# Patient Record
Sex: Male | Born: 1945 | Race: White | Hispanic: No | Marital: Married | Smoking: Former smoker
Health system: Southern US, Community
[De-identification: ages and names within clinical notes are randomized; demographics above are authoritative.]

## PROBLEM LIST (undated history)

## (undated) DIAGNOSIS — J449 Chronic obstructive pulmonary disease, unspecified: Secondary | ICD-10-CM

## (undated) DIAGNOSIS — N5201 Erectile dysfunction due to arterial insufficiency: Secondary | ICD-10-CM

## (undated) DIAGNOSIS — I10 Essential (primary) hypertension: Secondary | ICD-10-CM

## (undated) DIAGNOSIS — I2510 Atherosclerotic heart disease of native coronary artery without angina pectoris: Secondary | ICD-10-CM

## (undated) DIAGNOSIS — K219 Gastro-esophageal reflux disease without esophagitis: Secondary | ICD-10-CM

## (undated) DIAGNOSIS — C801 Malignant (primary) neoplasm, unspecified: Secondary | ICD-10-CM

## (undated) DIAGNOSIS — F419 Anxiety disorder, unspecified: Secondary | ICD-10-CM

## (undated) HISTORY — DX: Chronic obstructive pulmonary disease, unspecified: J44.9

## (undated) HISTORY — DX: Erectile dysfunction due to arterial insufficiency: N52.01

## (undated) HISTORY — PX: CHOLECYSTECTOMY: SHX55

## (undated) HISTORY — DX: Gastro-esophageal reflux disease without esophagitis: K21.9

## (undated) HISTORY — DX: Atherosclerotic heart disease of native coronary artery without angina pectoris: I25.10

---

## 1999-12-12 ENCOUNTER — Emergency Department (HOSPITAL_COMMUNITY): Admission: EM | Admit: 1999-12-12 | Discharge: 1999-12-12 | Payer: Self-pay | Admitting: Emergency Medicine

## 2002-03-03 ENCOUNTER — Encounter: Payer: Self-pay | Admitting: *Deleted

## 2002-03-03 ENCOUNTER — Emergency Department (HOSPITAL_COMMUNITY): Admission: EM | Admit: 2002-03-03 | Discharge: 2002-03-03 | Payer: Self-pay | Admitting: *Deleted

## 2002-12-01 ENCOUNTER — Emergency Department (HOSPITAL_COMMUNITY): Admission: EM | Admit: 2002-12-01 | Discharge: 2002-12-01 | Payer: Self-pay | Admitting: Emergency Medicine

## 2009-04-23 HISTORY — PX: CARDIAC CATHETERIZATION: SHX172

## 2015-04-24 DIAGNOSIS — I639 Cerebral infarction, unspecified: Secondary | ICD-10-CM

## 2015-04-24 HISTORY — DX: Cerebral infarction, unspecified: I63.9

## 2017-05-24 DIAGNOSIS — I10 Essential (primary) hypertension: Secondary | ICD-10-CM | POA: Diagnosis not present

## 2017-05-24 DIAGNOSIS — R202 Paresthesia of skin: Secondary | ICD-10-CM | POA: Diagnosis not present

## 2017-05-24 DIAGNOSIS — M545 Low back pain: Secondary | ICD-10-CM | POA: Diagnosis not present

## 2017-05-24 DIAGNOSIS — J449 Chronic obstructive pulmonary disease, unspecified: Secondary | ICD-10-CM | POA: Diagnosis not present

## 2017-05-24 DIAGNOSIS — Z79899 Other long term (current) drug therapy: Secondary | ICD-10-CM | POA: Diagnosis not present

## 2017-05-28 DIAGNOSIS — Z79891 Long term (current) use of opiate analgesic: Secondary | ICD-10-CM | POA: Diagnosis not present

## 2017-05-28 DIAGNOSIS — J449 Chronic obstructive pulmonary disease, unspecified: Secondary | ICD-10-CM | POA: Diagnosis not present

## 2017-05-28 DIAGNOSIS — M545 Low back pain: Secondary | ICD-10-CM | POA: Diagnosis not present

## 2017-05-28 DIAGNOSIS — Z79899 Other long term (current) drug therapy: Secondary | ICD-10-CM | POA: Diagnosis not present

## 2017-05-29 DIAGNOSIS — Z79899 Other long term (current) drug therapy: Secondary | ICD-10-CM | POA: Diagnosis not present

## 2017-06-04 DIAGNOSIS — M79671 Pain in right foot: Secondary | ICD-10-CM | POA: Diagnosis not present

## 2017-06-04 DIAGNOSIS — R600 Localized edema: Secondary | ICD-10-CM | POA: Diagnosis not present

## 2017-06-04 DIAGNOSIS — Z79899 Other long term (current) drug therapy: Secondary | ICD-10-CM | POA: Diagnosis not present

## 2017-06-04 DIAGNOSIS — Z79891 Long term (current) use of opiate analgesic: Secondary | ICD-10-CM | POA: Diagnosis not present

## 2017-06-04 DIAGNOSIS — R202 Paresthesia of skin: Secondary | ICD-10-CM | POA: Diagnosis not present

## 2017-06-18 DIAGNOSIS — R202 Paresthesia of skin: Secondary | ICD-10-CM | POA: Diagnosis not present

## 2017-06-18 DIAGNOSIS — Z79891 Long term (current) use of opiate analgesic: Secondary | ICD-10-CM | POA: Diagnosis not present

## 2017-06-18 DIAGNOSIS — M545 Low back pain: Secondary | ICD-10-CM | POA: Diagnosis not present

## 2017-06-18 DIAGNOSIS — Z79899 Other long term (current) drug therapy: Secondary | ICD-10-CM | POA: Diagnosis not present

## 2017-07-02 DIAGNOSIS — Z79899 Other long term (current) drug therapy: Secondary | ICD-10-CM | POA: Diagnosis not present

## 2017-07-02 DIAGNOSIS — R252 Cramp and spasm: Secondary | ICD-10-CM | POA: Diagnosis not present

## 2017-07-02 DIAGNOSIS — M545 Low back pain: Secondary | ICD-10-CM | POA: Diagnosis not present

## 2017-07-02 DIAGNOSIS — Z79891 Long term (current) use of opiate analgesic: Secondary | ICD-10-CM | POA: Diagnosis not present

## 2017-07-30 DIAGNOSIS — I10 Essential (primary) hypertension: Secondary | ICD-10-CM | POA: Diagnosis not present

## 2017-07-30 DIAGNOSIS — Z79891 Long term (current) use of opiate analgesic: Secondary | ICD-10-CM | POA: Diagnosis not present

## 2017-07-30 DIAGNOSIS — Z79899 Other long term (current) drug therapy: Secondary | ICD-10-CM | POA: Diagnosis not present

## 2017-07-30 DIAGNOSIS — M545 Low back pain: Secondary | ICD-10-CM | POA: Diagnosis not present

## 2017-08-06 DIAGNOSIS — I10 Essential (primary) hypertension: Secondary | ICD-10-CM | POA: Diagnosis not present

## 2017-08-06 DIAGNOSIS — Z79891 Long term (current) use of opiate analgesic: Secondary | ICD-10-CM | POA: Diagnosis not present

## 2017-08-06 DIAGNOSIS — M545 Low back pain: Secondary | ICD-10-CM | POA: Diagnosis not present

## 2017-08-06 DIAGNOSIS — Z79899 Other long term (current) drug therapy: Secondary | ICD-10-CM | POA: Diagnosis not present

## 2017-09-04 DIAGNOSIS — I10 Essential (primary) hypertension: Secondary | ICD-10-CM | POA: Diagnosis not present

## 2017-09-04 DIAGNOSIS — Z79899 Other long term (current) drug therapy: Secondary | ICD-10-CM | POA: Diagnosis not present

## 2017-09-04 DIAGNOSIS — R202 Paresthesia of skin: Secondary | ICD-10-CM | POA: Diagnosis not present

## 2017-09-04 DIAGNOSIS — Z79891 Long term (current) use of opiate analgesic: Secondary | ICD-10-CM | POA: Diagnosis not present

## 2017-09-04 DIAGNOSIS — M545 Low back pain: Secondary | ICD-10-CM | POA: Diagnosis not present

## 2017-09-04 DIAGNOSIS — J449 Chronic obstructive pulmonary disease, unspecified: Secondary | ICD-10-CM | POA: Diagnosis not present

## 2017-10-08 DIAGNOSIS — Z79891 Long term (current) use of opiate analgesic: Secondary | ICD-10-CM | POA: Diagnosis not present

## 2017-10-08 DIAGNOSIS — I10 Essential (primary) hypertension: Secondary | ICD-10-CM | POA: Diagnosis not present

## 2017-10-08 DIAGNOSIS — Z79899 Other long term (current) drug therapy: Secondary | ICD-10-CM | POA: Diagnosis not present

## 2017-10-08 DIAGNOSIS — M545 Low back pain: Secondary | ICD-10-CM | POA: Diagnosis not present

## 2017-11-05 DIAGNOSIS — Z79899 Other long term (current) drug therapy: Secondary | ICD-10-CM | POA: Diagnosis not present

## 2017-11-05 DIAGNOSIS — M545 Low back pain: Secondary | ICD-10-CM | POA: Diagnosis not present

## 2017-11-05 DIAGNOSIS — Z79891 Long term (current) use of opiate analgesic: Secondary | ICD-10-CM | POA: Diagnosis not present

## 2017-12-04 DIAGNOSIS — Z79899 Other long term (current) drug therapy: Secondary | ICD-10-CM | POA: Diagnosis not present

## 2017-12-04 DIAGNOSIS — Z79891 Long term (current) use of opiate analgesic: Secondary | ICD-10-CM | POA: Diagnosis not present

## 2017-12-04 DIAGNOSIS — I10 Essential (primary) hypertension: Secondary | ICD-10-CM | POA: Diagnosis not present

## 2017-12-04 DIAGNOSIS — M545 Low back pain: Secondary | ICD-10-CM | POA: Diagnosis not present

## 2018-01-01 DIAGNOSIS — M545 Low back pain: Secondary | ICD-10-CM | POA: Diagnosis not present

## 2018-01-01 DIAGNOSIS — I10 Essential (primary) hypertension: Secondary | ICD-10-CM | POA: Diagnosis not present

## 2018-01-01 DIAGNOSIS — Z79899 Other long term (current) drug therapy: Secondary | ICD-10-CM | POA: Diagnosis not present

## 2018-01-01 DIAGNOSIS — Z79891 Long term (current) use of opiate analgesic: Secondary | ICD-10-CM | POA: Diagnosis not present

## 2018-01-30 DIAGNOSIS — M545 Low back pain: Secondary | ICD-10-CM | POA: Diagnosis not present

## 2018-01-30 DIAGNOSIS — R202 Paresthesia of skin: Secondary | ICD-10-CM | POA: Diagnosis not present

## 2018-01-30 DIAGNOSIS — Z79891 Long term (current) use of opiate analgesic: Secondary | ICD-10-CM | POA: Diagnosis not present

## 2018-01-30 DIAGNOSIS — I10 Essential (primary) hypertension: Secondary | ICD-10-CM | POA: Diagnosis not present

## 2018-01-30 DIAGNOSIS — E785 Hyperlipidemia, unspecified: Secondary | ICD-10-CM | POA: Diagnosis not present

## 2018-01-30 DIAGNOSIS — Z79899 Other long term (current) drug therapy: Secondary | ICD-10-CM | POA: Diagnosis not present

## 2018-02-27 DIAGNOSIS — I10 Essential (primary) hypertension: Secondary | ICD-10-CM | POA: Diagnosis not present

## 2018-02-27 DIAGNOSIS — M545 Low back pain: Secondary | ICD-10-CM | POA: Diagnosis not present

## 2018-02-27 DIAGNOSIS — J449 Chronic obstructive pulmonary disease, unspecified: Secondary | ICD-10-CM | POA: Diagnosis not present

## 2018-02-27 DIAGNOSIS — Z79899 Other long term (current) drug therapy: Secondary | ICD-10-CM | POA: Diagnosis not present

## 2018-02-27 DIAGNOSIS — J439 Emphysema, unspecified: Secondary | ICD-10-CM | POA: Diagnosis not present

## 2018-04-02 DIAGNOSIS — R202 Paresthesia of skin: Secondary | ICD-10-CM | POA: Diagnosis not present

## 2018-04-02 DIAGNOSIS — Z79891 Long term (current) use of opiate analgesic: Secondary | ICD-10-CM | POA: Diagnosis not present

## 2018-04-02 DIAGNOSIS — Z79899 Other long term (current) drug therapy: Secondary | ICD-10-CM | POA: Diagnosis not present

## 2018-04-02 DIAGNOSIS — J449 Chronic obstructive pulmonary disease, unspecified: Secondary | ICD-10-CM | POA: Diagnosis not present

## 2018-04-02 DIAGNOSIS — M545 Low back pain: Secondary | ICD-10-CM | POA: Diagnosis not present

## 2018-04-02 DIAGNOSIS — I10 Essential (primary) hypertension: Secondary | ICD-10-CM | POA: Diagnosis not present

## 2018-05-05 DIAGNOSIS — K219 Gastro-esophageal reflux disease without esophagitis: Secondary | ICD-10-CM | POA: Diagnosis not present

## 2018-05-05 DIAGNOSIS — Z79899 Other long term (current) drug therapy: Secondary | ICD-10-CM | POA: Diagnosis not present

## 2018-05-05 DIAGNOSIS — M79672 Pain in left foot: Secondary | ICD-10-CM | POA: Diagnosis not present

## 2018-05-05 DIAGNOSIS — I10 Essential (primary) hypertension: Secondary | ICD-10-CM | POA: Diagnosis not present

## 2018-05-05 DIAGNOSIS — M5442 Lumbago with sciatica, left side: Secondary | ICD-10-CM | POA: Diagnosis not present

## 2018-05-05 DIAGNOSIS — M79671 Pain in right foot: Secondary | ICD-10-CM | POA: Diagnosis not present

## 2018-05-05 DIAGNOSIS — M545 Low back pain: Secondary | ICD-10-CM | POA: Diagnosis not present

## 2018-06-03 DIAGNOSIS — Z79899 Other long term (current) drug therapy: Secondary | ICD-10-CM | POA: Diagnosis not present

## 2018-06-03 DIAGNOSIS — M5442 Lumbago with sciatica, left side: Secondary | ICD-10-CM | POA: Diagnosis not present

## 2018-06-03 DIAGNOSIS — K219 Gastro-esophageal reflux disease without esophagitis: Secondary | ICD-10-CM | POA: Diagnosis not present

## 2018-06-03 DIAGNOSIS — Z79891 Long term (current) use of opiate analgesic: Secondary | ICD-10-CM | POA: Diagnosis not present

## 2018-07-01 DIAGNOSIS — E785 Hyperlipidemia, unspecified: Secondary | ICD-10-CM | POA: Diagnosis not present

## 2018-07-01 DIAGNOSIS — M545 Low back pain: Secondary | ICD-10-CM | POA: Diagnosis not present

## 2018-07-01 DIAGNOSIS — Z79891 Long term (current) use of opiate analgesic: Secondary | ICD-10-CM | POA: Diagnosis not present

## 2018-07-01 DIAGNOSIS — Z79899 Other long term (current) drug therapy: Secondary | ICD-10-CM | POA: Diagnosis not present

## 2018-07-01 DIAGNOSIS — I10 Essential (primary) hypertension: Secondary | ICD-10-CM | POA: Diagnosis not present

## 2018-08-04 DIAGNOSIS — Z79891 Long term (current) use of opiate analgesic: Secondary | ICD-10-CM | POA: Diagnosis not present

## 2018-08-04 DIAGNOSIS — M545 Low back pain: Secondary | ICD-10-CM | POA: Diagnosis not present

## 2018-08-04 DIAGNOSIS — I10 Essential (primary) hypertension: Secondary | ICD-10-CM | POA: Diagnosis not present

## 2018-08-04 DIAGNOSIS — K219 Gastro-esophageal reflux disease without esophagitis: Secondary | ICD-10-CM | POA: Diagnosis not present

## 2018-08-13 DIAGNOSIS — K5790 Diverticulosis of intestine, part unspecified, without perforation or abscess without bleeding: Secondary | ICD-10-CM | POA: Diagnosis not present

## 2018-08-13 DIAGNOSIS — R0789 Other chest pain: Secondary | ICD-10-CM | POA: Diagnosis not present

## 2018-08-13 DIAGNOSIS — Z7902 Long term (current) use of antithrombotics/antiplatelets: Secondary | ICD-10-CM | POA: Diagnosis not present

## 2018-08-13 DIAGNOSIS — N200 Calculus of kidney: Secondary | ICD-10-CM | POA: Diagnosis not present

## 2018-08-13 DIAGNOSIS — Z79899 Other long term (current) drug therapy: Secondary | ICD-10-CM | POA: Diagnosis not present

## 2018-08-13 DIAGNOSIS — I700 Atherosclerosis of aorta: Secondary | ICD-10-CM | POA: Diagnosis not present

## 2018-08-13 DIAGNOSIS — S39012A Strain of muscle, fascia and tendon of lower back, initial encounter: Secondary | ICD-10-CM | POA: Diagnosis not present

## 2018-08-13 DIAGNOSIS — Z8673 Personal history of transient ischemic attack (TIA), and cerebral infarction without residual deficits: Secondary | ICD-10-CM | POA: Diagnosis not present

## 2018-08-13 DIAGNOSIS — Z87891 Personal history of nicotine dependence: Secondary | ICD-10-CM | POA: Diagnosis not present

## 2018-08-13 DIAGNOSIS — D3501 Benign neoplasm of right adrenal gland: Secondary | ICD-10-CM | POA: Diagnosis not present

## 2018-08-13 DIAGNOSIS — X500XXA Overexertion from strenuous movement or load, initial encounter: Secondary | ICD-10-CM | POA: Diagnosis not present

## 2018-08-13 DIAGNOSIS — I10 Essential (primary) hypertension: Secondary | ICD-10-CM | POA: Diagnosis not present

## 2018-09-01 DIAGNOSIS — K219 Gastro-esophageal reflux disease without esophagitis: Secondary | ICD-10-CM | POA: Diagnosis not present

## 2018-09-01 DIAGNOSIS — Z79899 Other long term (current) drug therapy: Secondary | ICD-10-CM | POA: Diagnosis not present

## 2018-09-01 DIAGNOSIS — I10 Essential (primary) hypertension: Secondary | ICD-10-CM | POA: Diagnosis not present

## 2018-09-01 DIAGNOSIS — Z79891 Long term (current) use of opiate analgesic: Secondary | ICD-10-CM | POA: Diagnosis not present

## 2018-09-01 DIAGNOSIS — M5442 Lumbago with sciatica, left side: Secondary | ICD-10-CM | POA: Diagnosis not present

## 2018-09-01 DIAGNOSIS — M545 Low back pain: Secondary | ICD-10-CM | POA: Diagnosis not present

## 2018-09-02 DIAGNOSIS — Z79891 Long term (current) use of opiate analgesic: Secondary | ICD-10-CM | POA: Diagnosis not present

## 2018-09-02 DIAGNOSIS — M5442 Lumbago with sciatica, left side: Secondary | ICD-10-CM | POA: Diagnosis not present

## 2019-12-14 ENCOUNTER — Ambulatory Visit: Payer: Self-pay | Admitting: Urology

## 2019-12-25 ENCOUNTER — Ambulatory Visit (INDEPENDENT_AMBULATORY_CARE_PROVIDER_SITE_OTHER): Payer: Medicare HMO | Admitting: Urology

## 2019-12-25 ENCOUNTER — Encounter: Payer: Self-pay | Admitting: Urology

## 2019-12-25 ENCOUNTER — Other Ambulatory Visit: Payer: Self-pay

## 2019-12-25 VITALS — BP 160/88 | HR 103 | Temp 98.2°F | Ht 72.0 in | Wt 212.0 lb

## 2019-12-25 DIAGNOSIS — R972 Elevated prostate specific antigen [PSA]: Secondary | ICD-10-CM | POA: Diagnosis not present

## 2019-12-25 DIAGNOSIS — N401 Enlarged prostate with lower urinary tract symptoms: Secondary | ICD-10-CM | POA: Diagnosis not present

## 2019-12-25 DIAGNOSIS — R351 Nocturia: Secondary | ICD-10-CM

## 2019-12-25 DIAGNOSIS — N138 Other obstructive and reflux uropathy: Secondary | ICD-10-CM

## 2019-12-25 DIAGNOSIS — R350 Frequency of micturition: Secondary | ICD-10-CM | POA: Diagnosis not present

## 2019-12-25 DIAGNOSIS — R3129 Other microscopic hematuria: Secondary | ICD-10-CM

## 2019-12-25 LAB — URINALYSIS, ROUTINE W REFLEX MICROSCOPIC
Bilirubin, UA: NEGATIVE
Glucose, UA: NEGATIVE
Leukocytes,UA: NEGATIVE
Nitrite, UA: NEGATIVE
Specific Gravity, UA: >1.03 — ABNORMAL HIGH (ref 1.005–1.030)
Urobilinogen, Ur: 0.2 mg/dL (ref 0.2–1.0)
pH, UA: 5.5 (ref 5.0–7.5)

## 2019-12-25 LAB — MICROSCOPIC EXAMINATION
Bacteria, UA: NONE SEEN
Epithelial Cells (non renal): NONE SEEN /hpf (ref 0–10)
Renal Epithel, UA: NONE SEEN /hpf
WBC, UA: NONE SEEN /hpf (ref 0–5)

## 2019-12-25 NOTE — Patient Instructions (Signed)
   Appointment Time: Appointment Date:  Location: [facility]   Prostate Biopsy Instructions  Stop all aspirin or blood thinners (aspirin, plavix, coumadin, warfarin, motrin, ibuprofen, advil, aleve, naproxen, naprosyn) for 7 days prior to the procedure.  If you have any questions about stopping these medications, please contact your primary care physician or cardiologist.  Having a light meal prior to the procedure is recommended.  If you are diabetic or have low blood sugar please bring a small snack or glucose tablet.  A Fleets enema is needed to be purchased over the counter at a local pharmacy and used 2 hours before you scheduled appointment.  This can be purchased over the counter at any pharmacy.  Antibiotics will be administered in the clinic at the time of the procedure and 1 tablet has been sent to your pharmacy. Please take the antibiotic as prescribed.    Please bring someone with you to the procedure to drive you home.   If you have any questions or concerns, please feel free to call the office at [phone] or send a Mychart message.    Thank you, [facility]

## 2019-12-25 NOTE — Progress Notes (Signed)
Subjective: 1. Elevated PSA   2. BPH with urinary obstruction   3. Urinary frequency   4. Nocturia   5. Microhematuria      Barry Alvarado is a 74 yo male who is sent in consultation by Dr. Jonny Ruiz for an elevated PSA with a further rise on a repeat.  I am unable to get the levels as they were not in the referral documents and we are unable to reach anyone in the referring office.  The patient is unaware of the levels.   He has had some issues with voiding and is on tamsulosin which has been helping.  He has nocturia x 1-2.  He had frequency but that has improved on tamsulosin.  He has urgency and has had UUI but that has improved.  He has a variable stream.  He feels like he doesn't empty completely.  He has no dysuria or hematuria.  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   He has had no UTI's but he may have had stones in the past but no GU surgery.  He has some issues with ED and has been given sildenafil.  He is a former smoker but none in 12 years.    ROS:  Review of Systems  Constitutional: Negative for chills, fever and weight loss.  Respiratory: Positive for shortness of breath.   Cardiovascular: Positive for leg swelling. Negative for chest pain.  Gastrointestinal: Positive for diarrhea and heartburn.  Genitourinary: Positive for frequency and urgency. Negative for flank pain and hematuria.  Musculoskeletal: Positive for back pain and joint pain.  Skin: Negative for itching and rash.  Neurological: Positive for focal weakness (left leg).  Endo/Heme/Allergies: Negative for polydipsia. Bruises/bleeds easily.  Psychiatric/Behavioral: The patient is nervous/anxious.   All other systems reviewed and are negative.   Allergies  Allergen Reactions  . Penicillins Hives  . Atorvastatin   . Gabapentin   . Omeprazole   . Shellfish Allergy   . Tramadol   . Zoloft [Sertraline]     Past Medical History:  Diagnosis Date  . CAD (coronary artery disease)   . COPD (chronic  obstructive pulmonary disease) ([place])   . CVA (cerebral vascular accident) ([place]) 2017   He has had 5 strokes.   . Erectile dysfunction due to arterial insufficiency   . GERD (gastroesophageal reflux disease)     Past Surgical History:  Procedure Laterality Date  . CHOLECYSTECTOMY  4-5 yrs ago    Social History   Socioeconomic History  . Marital status: Married    Spouse name: Not on file  . Number of children: Not on file  . Years of education: Not on file  . Highest education level: Not on file  Occupational History  . Not on file  Tobacco Use  . Smoking status: Former Research scientist (life sciences)  . Smokeless tobacco: Never Used  Substance and Sexual Activity  . Alcohol use: Yes    Comment: occasional  . Drug use: Not Currently  . Sexual activity: Yes  Other Topics Concern  . Not on file  Social History Narrative  . Not on file   Social Determinants of Health   Financial Resource Strain:   . Difficulty of Paying Living Expenses: Not on file  Food Insecurity:   . Worried About Charity fundraiser in the Last Year: Not on file  . Ran Out of Food in the Last Year: Not on file  Transportation Needs:   . Lack of Transportation (Medical): Not  on file  . Lack of Transportation (Non-Medical): Not on file  Physical Activity:   . Days of Exercise per Week: Not on file  . Minutes of Exercise per Session: Not on file  Stress:   . Feeling of Stress : Not on file  Social Connections:   . Frequency of Communication with Friends and Family: Not on file  . Frequency of Social Gatherings with Friends and Family: Not on file  . Attends Religious Services: Not on file  . Active Member of Clubs or Organizations: Not on file  . Attends Archivist Meetings: Not on file  . Marital Status: Not on file  Intimate Partner Violence:   . Fear of Current or Ex-Partner: Not on file  . Emotionally Abused: Not on file  . Physically Abused: Not on file  . Sexually Abused: Not on file    Family  History  Problem Relation Age of Onset  . Alzheimer's disease Mother   . Heart attack Father     Anti-infectives: Anti-infectives (From admission, onward)   None      Current Outpatient Medications  Medication Sig Dispense Refill  . amlodipine-atorvastatin (CADUET) 10-10 MG tablet Take 1 tablet by mouth daily.    . budesonide-formoterol (SYMBICORT) 160-4.5 MCG/ACT inhaler Inhale 2 puffs into the lungs 2 (two) times daily.    . clopidogrel (PLAVIX) 75 MG tablet Take 75 mg by mouth daily.    Marland Kitchen Dextran 70-Hypromellose 0.1-0.3 % SOLN Apply to eye.    . gabapentin (NEURONTIN) 300 MG capsule Take 300 mg by mouth 3 (three) times daily.    . hydrochlorothiazide (HYDRODIURIL) 50 MG tablet Take 50 mg by mouth daily.    Marland Kitchen lidocaine (LIDODERM) 5 % Place 1 patch onto the skin daily. Remove & Discard patch within 12 hours or as directed by MD    . LORazepam (ATIVAN) 1 MG tablet Take 1 mg by mouth every 8 (eight) hours.    . Multiple Vitamin (MULTIVITAMIN) tablet Take 1 tablet by mouth daily.    . sildenafil (VIAGRA) 100 MG tablet Take 100 mg by mouth daily as needed for erectile dysfunction.    . triamcinolone cream (KENALOG) 0.5 % Apply 1 application topically 3 (three) times daily.     No current facility-administered medications for this visit.     Objective: Vital signs in last 24 hours: BP (!) 160/88   Pulse (!) 103   Temp 98.2 F (36.8 C)   Ht 6' (1.829 m)   Wt 212 lb (96.2 kg)   BMI 28.75 kg/m   Intake/Output from previous day: No intake/output data recorded. Intake/Output this shift: @IOTHISSHIFT @   Physical Exam Vitals reviewed.  Constitutional:      Appearance: Normal appearance.  HENT:     Head: Normocephalic and atraumatic.  Cardiovascular:     Rate and Rhythm: Normal rate and regular rhythm.     Heart sounds: Normal heart sounds.  Pulmonary:     Effort: Pulmonary effort is normal. No respiratory distress.     Breath sounds: Normal breath sounds.  Abdominal:      General: Abdomen is flat.     Palpations: Abdomen is soft.     Tenderness: There is no abdominal tenderness.     Hernia: No hernia is present.  Genitourinary:    Comments: Normal penis, scrotum, testes and epididymis. AP without lesions, NST without mass. Prostate 2 + benign and SV's non-palpable.  Musculoskeletal:        General: No tenderness.  Normal range of motion.     Cervical back: Normal range of motion and neck supple.     Left lower leg: Edema present.  Lymphadenopathy:     Cervical: No cervical adenopathy.  Skin:    General: Skin is warm and dry.  Neurological:     Mental Status: He is alert and oriented to person, place, and time.     Motor: Weakness (left leg) present.  Psychiatric:        Mood and Affect: Mood normal.        Behavior: Behavior normal.     Lab Results:  No results found for this or any previous visit (from the past 24 hour(s)).  BMET No results for input(s): NA, K, CL, CO2, GLUCOSE, BUN, CREATININE, CALCIUM in the last 72 hours. PT/INR No results for input(s): LABPROT, INR in the last 72 hours. ABG No results for input(s): PHART, HCO3 in the last 72 hours.  Invalid input(s): PCO2, PO2  Studies/Results: No results found.   Assessment/Plan: Elevated PSA.  I don't have the labs results from his prior PSA's but will repeat one today along with a testosterone level.  His exam is benign.  I discussed the risks of a prostate biopsy with him but will wait to schedule until I see his PSA numbers.   BPH with BOO.  He is doing better with tamsulosin and will continue that med.   Microhematuria with 3-10 RBC's/HPF.   I will get him set up for a CT hematuria study and cystoscopy.   No orders of the defined types were placed in this encounter.    Orders Placed This Encounter  Procedures  . Microscopic Examination  . CT HEMATURIA WORKUP    Standing Status:   Future    Standing Expiration Date:   01/24/2020    Order Specific Question:   Reason  for Exam (SYMPTOM  OR DIAGNOSIS REQUIRED)    Answer:   Microhematuria.    Order Specific Question:   Preferred imaging location?    Answer:   [facility]    Order Specific Question:   Radiology Contrast Protocol - do NOT remove file path    Answer:   [email or web address]  . Urinalysis, Routine w reflex microscopic  . PSA, total and free    Standing Status:   Future    Number of Occurrences:   1    Standing Expiration Date:   01/24/2020  . Testosterone    Standing Status:   Future    Number of Occurrences:   1    Standing Expiration Date:   01/24/2020  . Basic metabolic panel    Standing Status:   Future    Number of Occurrences:   1    Standing Expiration Date:   01/24/2020     Return for Next available with CT results for cystoscopy. .    CC: CC: Dr. Jonny Ruiz.     Irine Seal 12/29/2019 [phone]

## 2019-12-26 LAB — BASIC METABOLIC PANEL
BUN/Creatinine Ratio: 14 (ref 10–24)
BUN: 15 mg/dL (ref 8–27)
CO2: 20 mmol/L (ref 20–29)
Calcium: 9.1 mg/dL (ref 8.6–10.2)
Chloride: 104 mmol/L (ref 96–106)
Creatinine, Ser: 1.1 mg/dL (ref 0.76–1.27)
GFR calc Af Amer: 77 mL/min/{1.73_m2} (ref >59)
GFR calc non Af Amer: 67 mL/min/{1.73_m2} (ref >59)
Glucose: 94 mg/dL (ref 65–99)
Potassium: 3.8 mmol/L (ref 3.5–5.2)
Sodium: 139 mmol/L (ref 134–144)

## 2019-12-26 LAB — PSA, TOTAL AND FREE
PSA, Free Pct: 19.5 %
PSA, Free: 1.54 ng/mL
Prostate Specific Ag, Serum: 7.9 ng/mL — ABNORMAL HIGH (ref 0.0–4.0)

## 2019-12-26 LAB — TESTOSTERONE: Testosterone: 360 ng/dL (ref 264–916)

## 2019-12-29 ENCOUNTER — Encounter: Payer: Self-pay | Admitting: Urology

## 2019-12-30 ENCOUNTER — Telehealth: Payer: Self-pay

## 2019-12-30 DIAGNOSIS — R972 Elevated prostate specific antigen [PSA]: Secondary | ICD-10-CM

## 2019-12-30 MED ORDER — LEVOFLOXACIN 750 MG PO TABS: 750.0000 mg | ORAL_TABLET | Freq: Once | ORAL | 0 refills | Status: AC

## 2019-12-30 NOTE — Progress Notes (Signed)
His PSA is 7.9 so he will need to be scheduled for the prostate [place] and biopsy.  Please send Levaquin 750mg  for the prep.  He will need to hold plavix per protocol.    His testosterone and BMP are normal.  He will still need the CT and cystoscopy.

## 2019-12-30 NOTE — Telephone Encounter (Signed)
Lft message for pt to return call.

## 2019-12-30 NOTE — Telephone Encounter (Signed)
-----   Message from Irine Seal, MD sent at 12/30/2019  6:59 AM EDT ----- His PSA is 7.9 so he will need to be scheduled for the prostate [place] and biopsy.  Please send Levaquin 750mg  for the prep.  He will need to hold plavix per protocol.    His testosterone and BMP are normal.  He will still need the CT and cystoscopy.

## 2019-12-31 NOTE — Telephone Encounter (Signed)
Lft mes. To return call.

## 2020-01-01 ENCOUNTER — Telehealth: Payer: Self-pay

## 2020-01-01 NOTE — Telephone Encounter (Signed)
-----   Message from Irine Seal, MD sent at 12/30/2019  6:59 AM EDT ----- His PSA is 7.9 so he will need to be scheduled for the prostate [place] and biopsy.  Please send Levaquin 750mg  for the prep.  He will need to hold plavix per protocol.    His testosterone and BMP are normal.  He will still need the CT and cystoscopy.

## 2020-01-01 NOTE — Telephone Encounter (Signed)
Called pt for 3rd time . LFT msg.

## 2020-01-04 ENCOUNTER — Encounter: Payer: Self-pay | Admitting: Urology

## 2020-01-04 NOTE — Progress Notes (Signed)
See prior notes

## 2020-01-04 NOTE — Telephone Encounter (Signed)
See prior task/note.

## 2020-01-08 ENCOUNTER — Other Ambulatory Visit (HOSPITAL_COMMUNITY): Payer: Self-pay | Admitting: Urology

## 2020-01-08 ENCOUNTER — Telehealth: Payer: Self-pay | Admitting: Urology

## 2020-01-08 DIAGNOSIS — R972 Elevated prostate specific antigen [PSA]: Secondary | ICD-10-CM

## 2020-01-08 NOTE — Telephone Encounter (Signed)
She called and asked that a nurse return her call regarding Mr Seeley.

## 2020-01-08 NOTE — Telephone Encounter (Signed)
Called number listed. No answer. No way to leave message.

## 2020-01-19 ENCOUNTER — Other Ambulatory Visit: Payer: Self-pay

## 2020-01-19 ENCOUNTER — Ambulatory Visit (HOSPITAL_COMMUNITY)
Admission: RE | Admit: 2020-01-19 | Discharge: 2020-01-19 | Disposition: A | Discharge status: Home / Self Care | Payer: Medicare HMO | Source: Ambulatory Visit | Attending: Urology | Admitting: Urology

## 2020-01-19 DIAGNOSIS — R3129 Other microscopic hematuria: Secondary | ICD-10-CM | POA: Insufficient documentation

## 2020-01-19 IMAGING — CT CT ABD-PEL WO/W CM
3 of 11 series · 11 of 46 positions shown, 17 images · IV contrast (omnipaque)
Comparison: None.

CLINICAL DATA: Microhematuria

EXAM:
CT ABDOMEN AND PELVIS WITHOUT AND WITH CONTRAST
TECHNIQUE: Multidetector CT imaging of the abdomen and pelvis was performed
following the standard protocol before and following the bolus
administration of intravenous contrast.
CONTRAST:  150mL OMNIPAQUE IOHEXOL 300 MG/ML  SOLN

[Series 2: axial pre · axial · non-contrast · 0.80mm/px · z∈[+966,+1280]mm · 4 of 105 slices shown]
[im 21/105  soft-tissue]
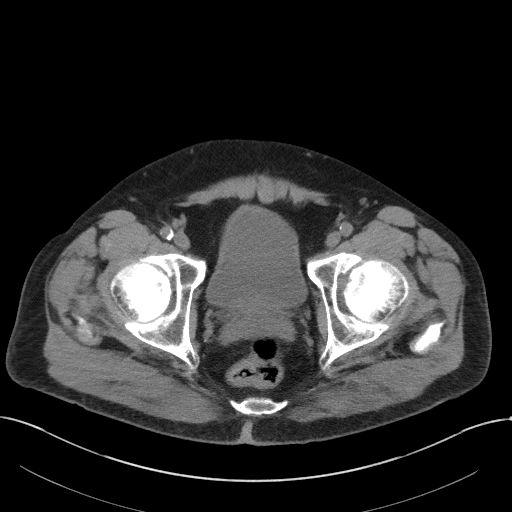
[im 42/105  soft-tissue]
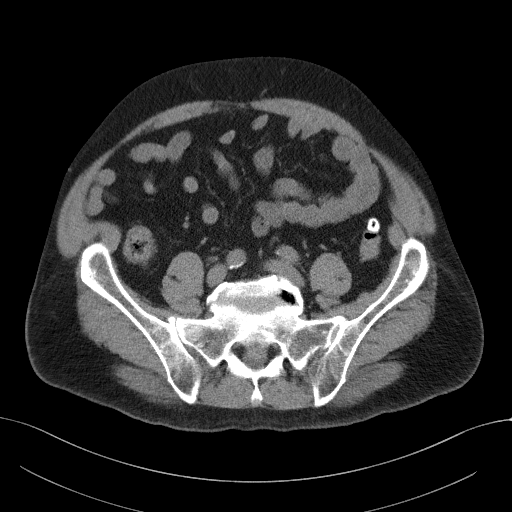
[im 63/105  soft-tissue]
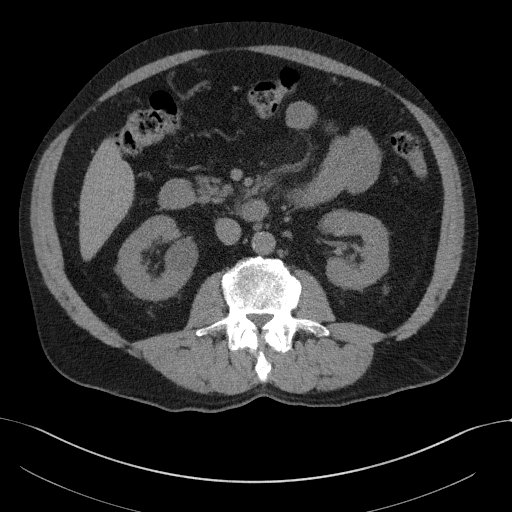
[im 84/105  soft-tissue]
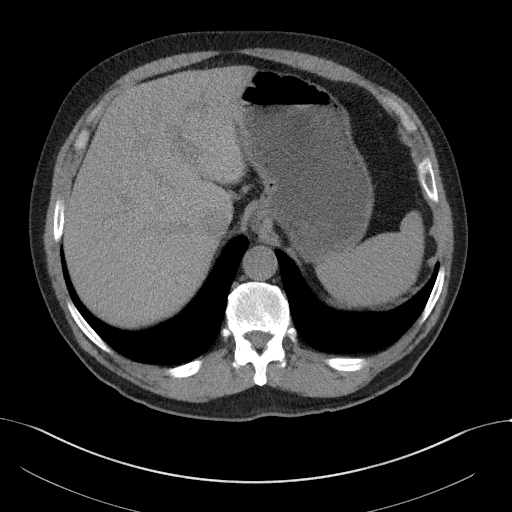

[Series 3: axial post · axial · 0.80mm/px · z∈[+950,+1296]mm · 5 of 105 slices shown, 10 images]
[im 18/105  soft-tissue]
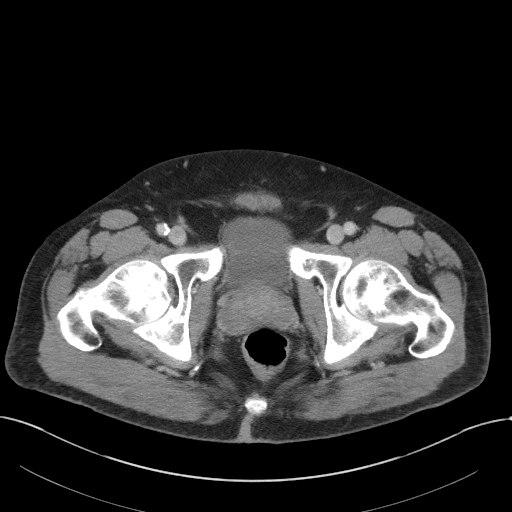
[im 18/105  bone]
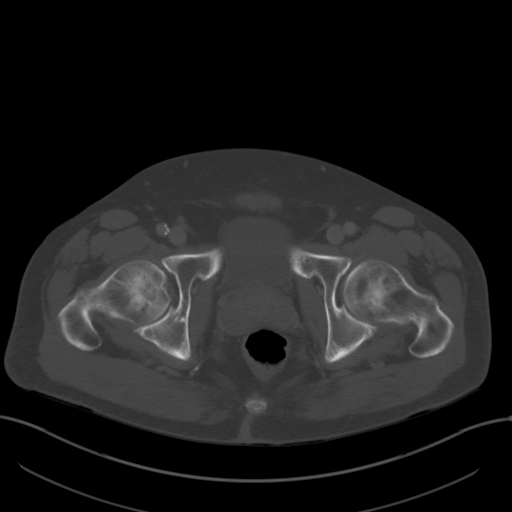
[im 35/105  soft-tissue]
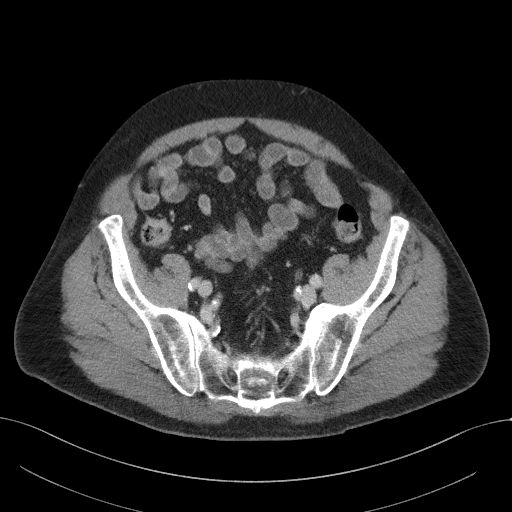
[im 35/105  lung]
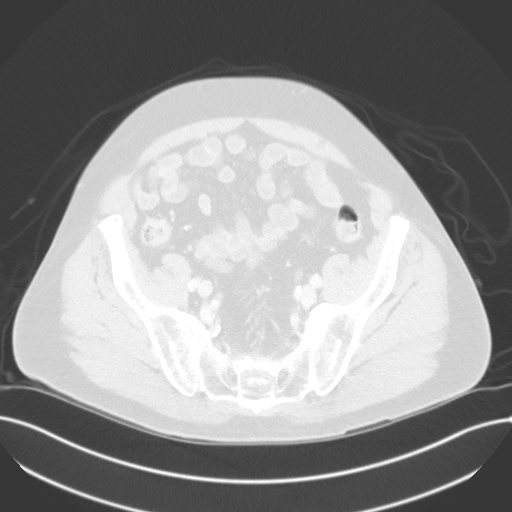
[im 53/105  soft-tissue]
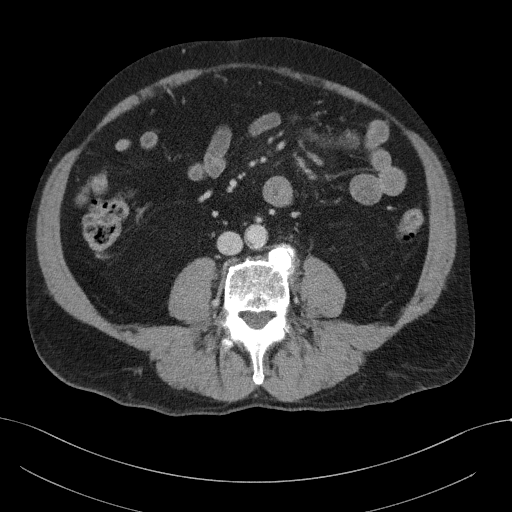
[im 53/105  lung]
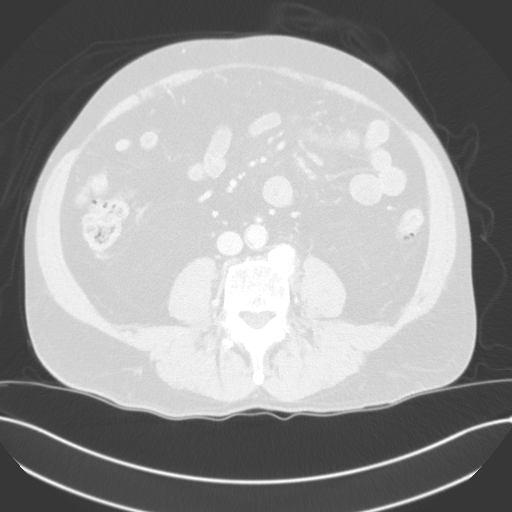
[im 70/105  soft-tissue]
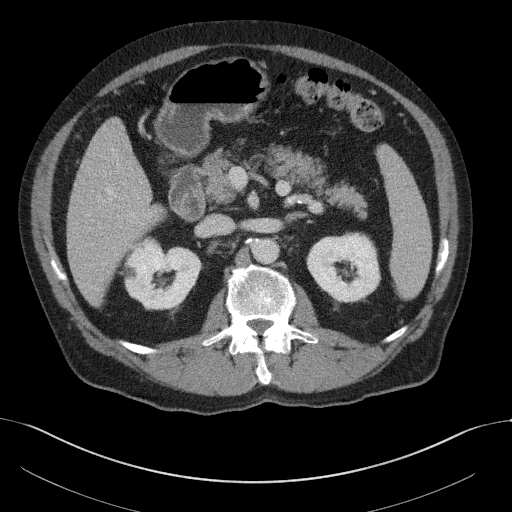
[im 70/105  lung]
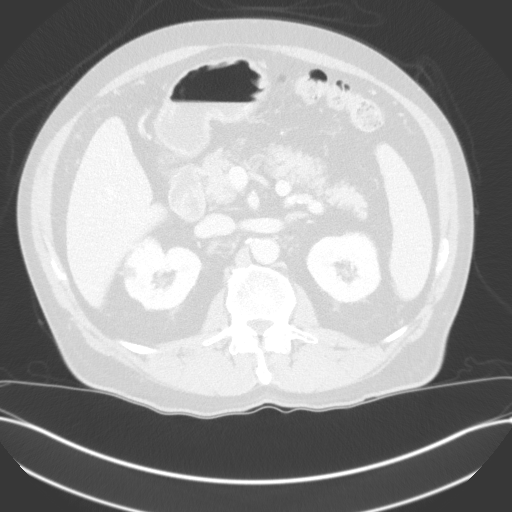
[im 87/105  soft-tissue]
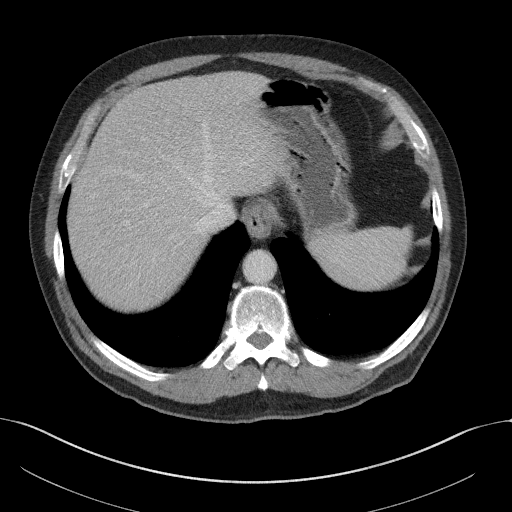
[im 87/105  lung]
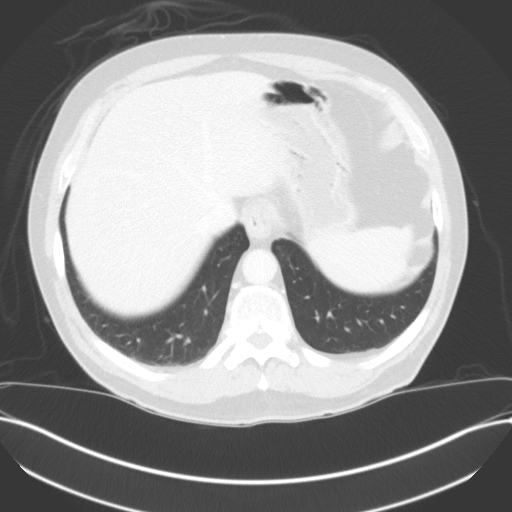

[Series 9: coronal pre · coronal · non-contrast · 0.78mm/px · 2 of 108 slices shown, 3 images]
[im 36/108  soft-tissue]
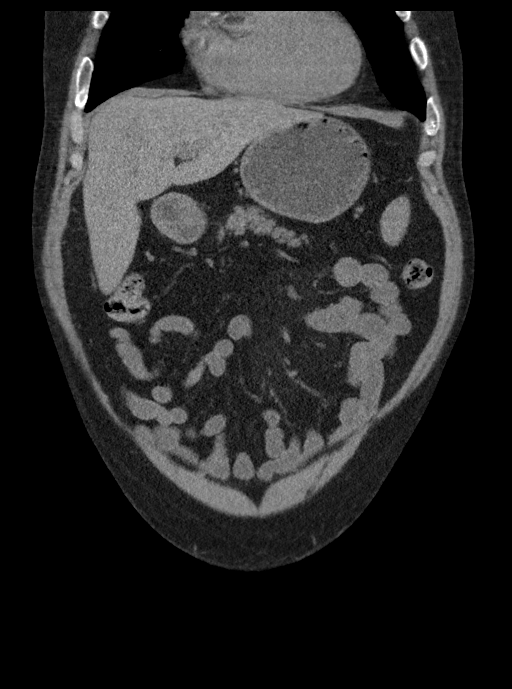
[im 36/108  bone]
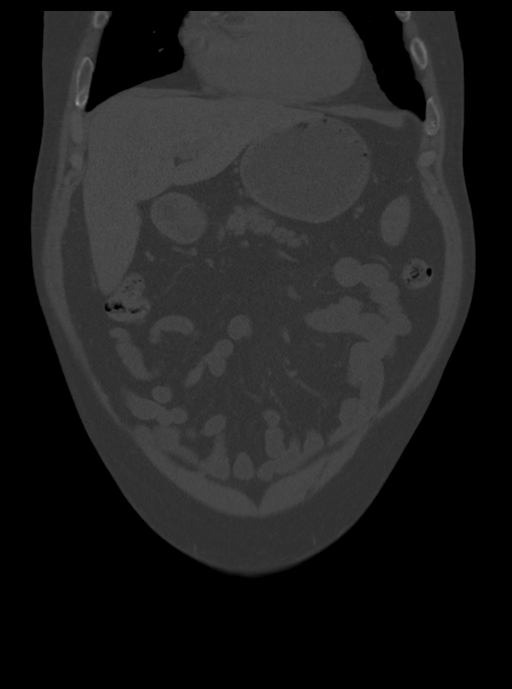
[im 72/108  soft-tissue]
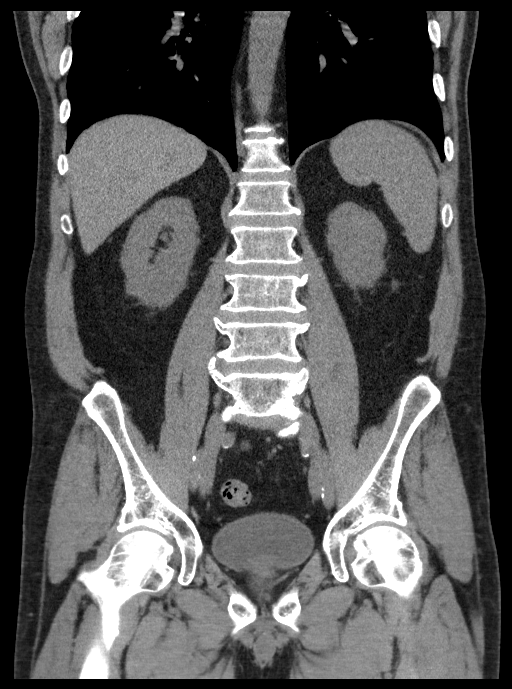

[11 of 46 positions shown; findings below may reference images not displayed]

FINDINGS: Lower chest: No acute abnormality.

Hepatobiliary: No focal liver abnormality is seen. Status post
cholecystectomy. No biliary dilatation.

Pancreas: Unremarkable. No pancreatic ductal dilatation or
surrounding inflammatory changes.

Spleen: Mild splenomegaly, maximum coronal span 13.5 cm.

Adrenals/Urinary Tract: Incidental, definitively benign, 1.3 cm fat
containing right adrenal adenoma. Multiple small bilateral renal
cysts and additional tiny subcentimeter lesions, too small to
characterize although likely subcentimeter cysts. No calculi or
hydronephrosis. No urinary tract filling defect on delayed phase
imaging. Bladder is unremarkable.

Stomach/Bowel: Stomach is within normal limits. Appendix appears
normal. No evidence of bowel wall thickening, distention, or
inflammatory changes. Sigmoid diverticulosis.

Vascular/Lymphatic: Aortic atherosclerosis. No enlarged abdominal or
pelvic lymph nodes.

Reproductive: Mild prostatomegaly.

Other: No abdominal wall hernia or abnormality. No abdominopelvic
ascites.

Musculoskeletal: No acute or significant osseous findings.
IMPRESSION: 1. No CT findings to explain microhematuria. No urinary tract mass,
calculi or hydronephrosis. No urinary tract filling defect on
delayed phase imaging.
2. Mild prostatomegaly.
3. Mild splenomegaly.
4. Aortic Atherosclerosis ([id number].A).

## 2020-01-19 MED ORDER — IOHEXOL 300 MG/ML  SOLN
150.0000 mL | Freq: Once | INTRAMUSCULAR | Status: AC | PRN
  Administered 2020-01-19: 150 mL via INTRAVENOUS

## 2020-01-22 ENCOUNTER — Other Ambulatory Visit: Payer: Self-pay | Admitting: Urology

## 2020-01-22 ENCOUNTER — Other Ambulatory Visit: Payer: Self-pay

## 2020-01-22 ENCOUNTER — Ambulatory Visit (HOSPITAL_COMMUNITY)
Admission: RE | Admit: 2020-01-22 | Discharge: 2020-01-22 | Disposition: A | Discharge status: Home / Self Care | Payer: Medicare HMO | Source: Ambulatory Visit | Attending: Urology | Admitting: Urology

## 2020-01-22 ENCOUNTER — Ambulatory Visit (INDEPENDENT_AMBULATORY_CARE_PROVIDER_SITE_OTHER): Payer: Medicare HMO | Admitting: Urology

## 2020-01-22 DIAGNOSIS — R972 Elevated prostate specific antigen [PSA]: Secondary | ICD-10-CM | POA: Insufficient documentation

## 2020-01-22 MED ORDER — CEFTRIAXONE SODIUM 1 G IJ SOLR
INTRAMUSCULAR | Status: AC
  Filled 2020-01-22: qty 10

## 2020-01-22 MED ORDER — LIDOCAINE HCL (PF) 2 % IJ SOLN
INTRAMUSCULAR | Status: AC
  Filled 2020-01-22: qty 10

## 2020-01-22 MED ORDER — LIDOCAINE HCL (PF) 1 % IJ SOLN
INTRAMUSCULAR | Status: AC
  Administered 2020-01-22: 2.1 mL via INTRAMUSCULAR
  Filled 2020-01-22: qty 5

## 2020-01-22 NOTE — Progress Notes (Signed)
Prostate Biopsy Procedure   Informed consent was obtained after discussing risks/benefits of the procedure.  A time out was performed to ensure correct patient identity.  Pre-Procedure: - Last PSA Level: 7.9 - Prostate exam findings: 2+ benign  - Rocephin 1 gm IM given prophylactically - Levaquin 750 mg administered PO -Transrectal Ultrasound performed using the 10MHz probe.  - Seminal Vesicles: normal - Prostate: symmetrical with minimal middle lobe.  - Prostate volume:  45 gm - Middle lobe: minimal - Hypoechoic/Hyperechoic lesions: left mid lateral wedge shaped area  - Prostate calculi: scant  Procedure: - Prostate block performed using 10 cc 1% lidocaine and needle  biopsies taken from sextant areas, a total of 12 under ultrasound guidance.   No additional cores were obtained.   Post-Procedure: - Patient tolerated the procedure well - He was counseled to seek immediate medical attention if experiences any severe pain, significant bleeding, or fevers - He will be called with the biopsy results when available.

## 2020-01-27 ENCOUNTER — Telehealth: Payer: Self-pay

## 2020-01-27 ENCOUNTER — Other Ambulatory Visit: Payer: Self-pay | Admitting: Urology

## 2020-01-27 DIAGNOSIS — C61 Malignant neoplasm of prostate: Secondary | ICD-10-CM

## 2020-01-29 HISTORY — PX: CYSTOSCOPY: SUR368

## 2020-01-29 NOTE — Telephone Encounter (Signed)
Dr. Jeffie Pollock talked with pt.

## 2020-02-02 ENCOUNTER — Encounter (HOSPITAL_COMMUNITY)
Admission: RE | Admit: 2020-02-02 | Discharge: 2020-02-02 | Disposition: A | Discharge status: Home / Self Care | Payer: Medicare HMO | Source: Ambulatory Visit | Attending: Urology | Admitting: Urology

## 2020-02-02 ENCOUNTER — Other Ambulatory Visit: Payer: Self-pay

## 2020-02-02 ENCOUNTER — Encounter (HOSPITAL_COMMUNITY): Payer: Self-pay

## 2020-02-02 DIAGNOSIS — C61 Malignant neoplasm of prostate: Secondary | ICD-10-CM | POA: Diagnosis not present

## 2020-02-02 HISTORY — DX: Essential (primary) hypertension: I10

## 2020-02-02 HISTORY — DX: Malignant (primary) neoplasm, unspecified: C80.1

## 2020-02-02 IMAGING — NM NM BONE WHOLE BODY
4 series · 4 of 4 positions shown · non-contrast
Comparison: None.

CLINICAL DATA: Prostate cancer, initial staging examination

EXAM:
NUCLEAR MEDICINE WHOLE BODY BONE SCAN
TECHNIQUE: Whole body anterior and posterior images were obtained approximately
3 hours after intravenous injection of radiopharmaceutical.
RADIOPHARMACEUTICALS:  21.4 mCi Bechnetium-00m MDP IV

[Series 1: whole body · 2.66mm/px · 1 of 1 slices shown (1 of 2)]
[im 1/1]
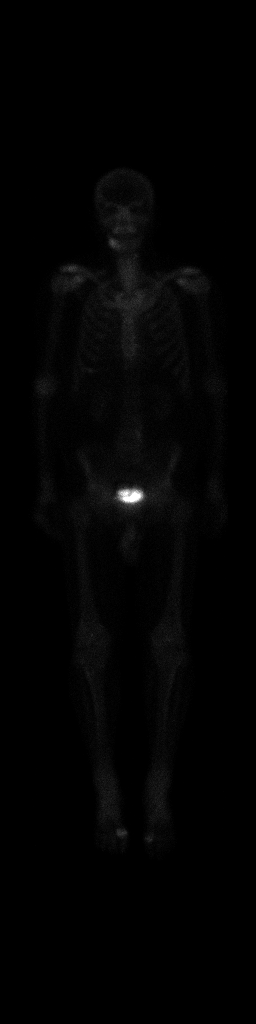

[Series 1: wbr_bone_40 whole body · 2.66mm/px · 1 of 1 slices shown (1 of 2)]
[im 1/1]
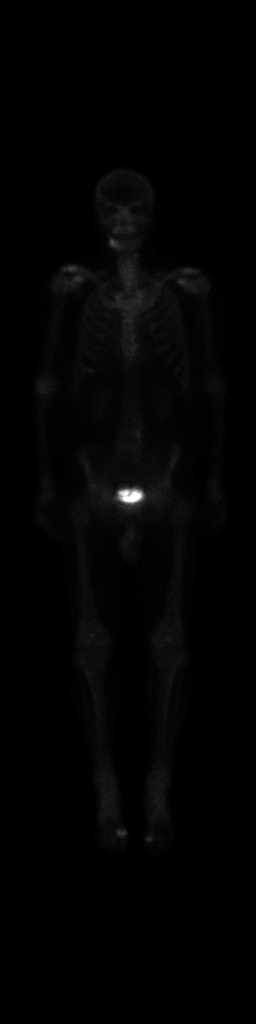

[Series 1: wbr_bone_40 whole body · 2.66mm/px · 1 of 1 slices shown (2 of 2)]
[im 1/1]
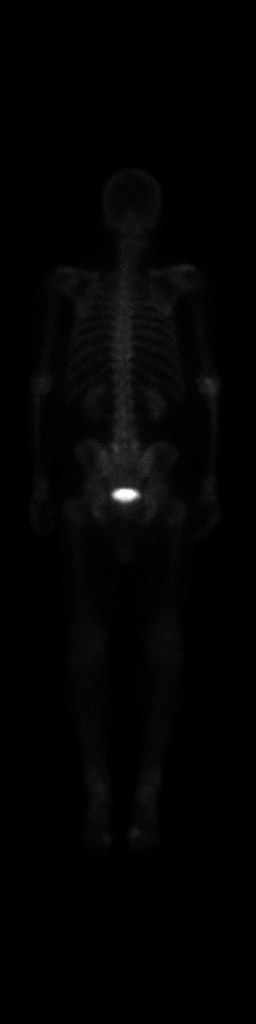

[Series 1: whole body · 2.66mm/px · 1 of 1 slices shown (2 of 2)]
[im 1/1]
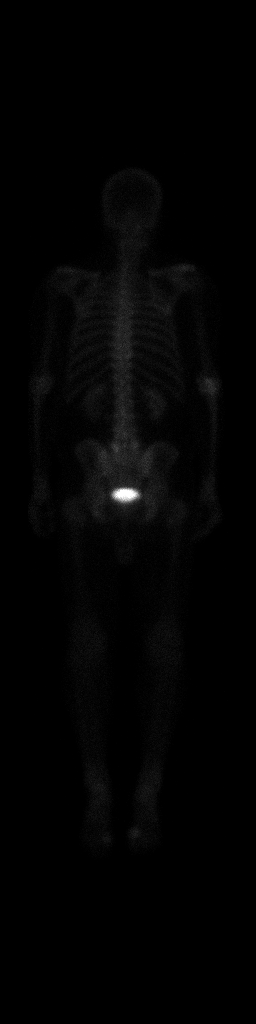

[4 of 4 positions shown; findings below may reference images not displayed]

FINDINGS: Focal uptake within the first metatarsophalangeal joints
bilaterally, shoulders bilaterally, and lumbar spine are likely
degenerative in nature. Focal uptake within the right mandible
likely relates to periodontal disease. No focal uptake is identified
to suggest osseous metastatic disease. Normal soft tissue
distribution. Normal uptake and excretion within the kidneys and
bladder.
IMPRESSION: No evidence of osseous metastatic disease.

## 2020-02-02 MED ORDER — TECHNETIUM TC 99M MEDRONATE IV KIT
20.0000 | PACK | Freq: Once | INTRAVENOUS | Status: AC | PRN
  Administered 2020-02-02: 21.4 via INTRAVENOUS

## 2020-02-04 NOTE — H&P (View-Only) (Signed)
Subjective: 1. Prostate cancer ([place])   2. 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 Granquist returns today in f/u to discuss his recent prostate biopsy on 01/22/20.   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  4/12 cores had Gleason 8 and a total of 9/12 cores are involved with prostate cancer.   He had a negative bone scan on 02/02/20 and a CT hematuria study on 01/19/20 showed no mets.   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  His prostate volume is 45ml. He has 3-10 RBC's on UA today and needs cystoscopy.    GU Hx: Mr. Reinwald is a 74 yo male who is sent in consultation by Dr. Jonny Ruiz for an elevated PSA with a further rise on a repeat.  I am unable to get the levels as they were not in the referral documents and we are unable to reach anyone in the referring office.  The patient is unaware of the levels.   He has had some issues with voiding and is on tamsulosin which has been helping.  He has nocturia x 1-2.  He had frequency but that has improved on tamsulosin.  He has urgency and has had UUI but that has improved.  He has a variable stream.  He feels like he doesn't empty completely.  He has no dysuria or hematuria.  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   He has had no UTI's but he may have had stones in the past but no GU surgery.  He has some issues with ED and has been given sildenafil.  He is a former smoker but none in 12 years.    ROS:  ROS  Allergies  Allergen Reactions  . Penicillins Hives  . Atorvastatin   . Gabapentin   . Omeprazole   . Shellfish Allergy   . Tramadol   . Zoloft [Sertraline]     Past Medical History:  Diagnosis Date  . CAD (coronary artery disease)   . Cancer [facility])    Prostate  . COPD (chronic obstructive pulmonary disease) ([place])   . CVA (cerebral vascular accident) ([place]) 2017   He has had 5 strokes.   . Erectile dysfunction due to arterial insufficiency   .  GERD (gastroesophageal reflux disease)   . Hypertension     Past Surgical History:  Procedure Laterality Date  . CHOLECYSTECTOMY  4-5 yrs ago    Social History   Socioeconomic History  . Marital status: Married    Spouse name: Not on file  . Number of children: Not on file  . Years of education: Not on file  . Highest education level: Not on file  Occupational History  . Not on file  Tobacco Use  . Smoking status: Former Research scientist (life sciences)  . Smokeless tobacco: Never Used  Substance and Sexual Activity  . Alcohol use: Yes    Comment: occasional  . Drug use: Not Currently  . Sexual activity: Yes  Other Topics Concern  . Not on file  Social History Narrative  . Not on file   Social Determinants of Health   Financial Resource Strain:   . Difficulty of Paying Living Expenses: Not on file  Food Insecurity:   . Worried About Charity fundraiser in the Last Year: Not on file  . Ran Out of Food in the Last Year: Not on file  Transportation Needs:   . Film/video editor (Medical): Not on file  . Lack of Transportation (Non-Medical): Not on file  Physical Activity:   . Days of Exercise per Week: Not on file  . Minutes of Exercise per Session: Not on file  Stress:   . Feeling of Stress : Not on file  Social Connections:   . Frequency of Communication with Friends and Family: Not on file  . Frequency of Social Gatherings with Friends and Family: Not on file  . Attends Religious Services: Not on file  . Active Member of Clubs or Organizations: Not on file  . Attends Archivist Meetings: Not on file  . Marital Status: Not on file  Intimate Partner Violence:   . Fear of Current or Ex-Partner: Not on file  . Emotionally Abused: Not on file  . Physically Abused: Not on file  . Sexually Abused: Not on file    Family History  Problem Relation Age of Onset  . Alzheimer's disease Mother   . Heart attack Father     Anti-infectives: Anti-infectives (From admission,  onward)   Start     Dose/Rate Route Frequency Ordered Stop   02/05/20 1115  CIPROFLOXACIN HCL 500 MG PO TABS        500 mg Oral  Once 02/05/20 1104 02/05/20 1225      Current Outpatient Medications  Medication Sig Dispense Refill  . amlodipine-atorvastatin (CADUET) 10-10 MG tablet Take 1 tablet by mouth daily.    . budesonide-formoterol (SYMBICORT) 160-4.5 MCG/ACT inhaler Inhale 2 puffs into the lungs 2 (two) times daily.    . clopidogrel (PLAVIX) 75 MG tablet Take 75 mg by mouth daily.    Marland Kitchen Dextran 70-Hypromellose 0.1-0.3 % SOLN Apply to eye.    . gabapentin (NEURONTIN) 300 MG capsule Take 300 mg by mouth 3 (three) times daily.    . hydrochlorothiazide (HYDRODIURIL) 50 MG tablet Take 50 mg by mouth daily.    Marland Kitchen lidocaine (LIDODERM) 5 % Place 1 patch onto the skin daily. Remove & Discard patch within 12 hours or as directed by MD    . LORazepam (ATIVAN) 1 MG tablet Take 1 mg by mouth every 8 (eight) hours.    . Multiple Vitamin (MULTIVITAMIN) tablet Take 1 tablet by mouth daily.    . pantoprazole (PROTONIX) 20 MG tablet     . sildenafil (VIAGRA) 100 MG tablet Take 100 mg by mouth daily as needed for erectile dysfunction.    . tamsulosin (FLOMAX) 0.4 MG CAPS capsule     . triamcinolone cream (KENALOG) 0.5 % Apply 1 application topically 3 (three) times daily.     No current facility-administered medications for this visit.     Objective: Vital signs in last 24 hours: BP (!) 152/75   Pulse 96   Temp 98.2 F (36.8 C)   Ht 6' (1.829 m)   Wt 212 lb (96.2 kg)   BMI 28.75 kg/m   Intake/Output from previous day: No intake/output data recorded. Intake/Output this shift: @IOTHISSHIFT @   Physical Exam  Lab Results:  Results for orders placed or performed in visit on 02/05/20 (from the past 24 hour(s))  Urinalysis, Routine w reflex microscopic     Status: Abnormal   Collection Time: 02/05/20 10:36 AM  Result Value Ref Range   Specific Gravity, UA 1.020 1.005 - 1.030   pH, UA  5.0 5.0 - 7.5   Color, UA Yellow Yellow   Appearance Ur Clear Clear   Leukocytes,UA Negative  Negative   Protein,UA 3+ (A) Negative/Trace   Glucose, UA Negative Negative   Ketones, UA Negative Negative   RBC, UA 1+ (A) Negative   Bilirubin, UA Negative Negative   Urobilinogen, Ur 0.2 0.2 - 1.0 mg/dL   Nitrite, UA Negative Negative   Microscopic Examination See below:    Narrative   Performed at:  [facility] [address] [place], [place]  [id number] Lab Director: Mina Marble MT, Phone:  [phone]  Microscopic Examination     Status: Abnormal   Collection Time: 02/05/20 10:36 AM   Urine  Result Value Ref Range   WBC, UA None seen 0 - 5 /hpf   RBC 3-10 (A) 0 - 2 /hpf   Epithelial Cells (non renal) None seen 0 - 10 /hpf   Renal Epithel, UA None seen None seen /hpf   Mucus, UA Present Not Estab.   Bacteria, UA None seen None seen/Few   Narrative   Performed at:  [facility] [address], [place], [place]  [id number] Lab Director: [place], Phone:  [phone]    BMET No results for input(s): NA, K, CL, CO2, GLUCOSE, BUN, CREATININE, CALCIUM in the last 72 hours. PT/INR No results for input(s): LABPROT, INR in the last 72 hours. ABG No results for input(s): PHART, HCO3 in the last 72 hours.  Invalid input(s): PCO2, PO2  Studies/Results: Procedure: Cystoscopy.  He was prepped with betadine and the urethra was instilled with 2% lidocaine jelly.  He was given 500mg  po of cipro.   The urethra was normal.  The prostate was 3-4cm with trilobar hyperplasia with obstruction.  The bladder had moderate trabeculation and there was a 1cm tumor on the left trigone.  The UO's were otherwise normal.      There were no complications.     Assessment/Plan: Prostate cancer.  He has T1c N0 M0 Gleason 8 prostate cancer in multiple cores and a single core with intraductal carcinoma.   I discuss options for therapy and feel that with his disease and  comorbidities that he will be best served with EXRT with 2 years of ADT.    I reviewed the risks and side effects of that option and will have him return soon to initiate ADT with firmagon.   I believe he will need to stay on firmagon because of his cardiovascular history.  I will send him to [facility] for consideration of radiation therapy.    Bladder cancer.  He has a small papillary tumor on the left trigone and will need a TURBT with instillation of gemcitabine.  I reviewed the risks of this procedure in detail and will get him cleared to come off of plavix for the procedure.  I also explained the need for surveillance cystoscopy going forward.    BPH with BOO.  He is doing better with tamsulosin and will continue that med.     Meds ordered this encounter  Medications  . ciprofloxacin (CIPRO) tablet 500 mg     Orders Placed This Encounter  Procedures  . Microscopic Examination  . Urinalysis, Routine w reflex microscopic  . Ambulatory referral to Radiation Oncology    Referral Priority:   Routine    Referral Type:   Consultation    Referral Reason:   Specialty Services Required    Referred to Provider:   Meda Klinefelter, MD    Requested Specialty:   Radiation Oncology    Number of Visits Requested:  1     Return for He will need to be scheduled for cystoscopy with a TURBT.  F/U in 1-2 weeks to begin Firmagon 240mg .    CC: Dr. Jonny Ruiz.     Irine Seal 02/05/2020 [phone]

## 2020-02-04 NOTE — Progress Notes (Signed)
Subjective: 1. Prostate cancer ([place])   2. 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  4/12 cores had Gleason 8 and a total of 9/12 cores are involved with prostate cancer.   He had a negative bone scan on 02/02/20 and a CT hematuria study on 01/19/20 showed no mets.   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  His prostate volume is 17ml. He has 3-10 RBC's on UA today and needs cystoscopy.    GU Hx: Barry Alvarado is a 74 yo male who is sent in consultation by Dr. Jonny Ruiz for an elevated PSA with a further rise on a repeat.  I am unable to get the levels as they were not in the referral documents and we are unable to reach anyone in the referring office.  The patient is unaware of the levels.   He has had some issues with voiding and is on tamsulosin which has been helping.  He has nocturia x 1-2.  He had frequency but that has improved on tamsulosin.  He has urgency and has had UUI but that has improved.  He has a variable stream.  He feels like he doesn't empty completely.  He has no dysuria or hematuria.  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   He has had no UTI's but he may have had stones in the past but no GU surgery.  He has some issues with ED and has been given sildenafil.  He is a former smoker but none in 12 years.    ROS:  ROS  Allergies  Allergen Reactions   Penicillins Hives   Atorvastatin    Gabapentin    Omeprazole    Shellfish Allergy    Tramadol    Zoloft [Sertraline]     Past Medical History:  Diagnosis Date   CAD (coronary artery disease)    Cancer ([place])    Prostate   COPD (chronic obstructive pulmonary disease) ([place])    CVA (cerebral vascular accident) ([place]) 2017   He has had 5 strokes.    Erectile dysfunction due to arterial insufficiency     GERD (gastroesophageal reflux disease)    Hypertension     Past Surgical History:  Procedure Laterality Date   CHOLECYSTECTOMY  4-5 yrs ago    Social History   Socioeconomic History   Marital status: Married    Spouse name: Not on file   Number of children: Not on file   Years of education: Not on file   Highest education level: Not on file  Occupational History   Not on file  Tobacco Use   Smoking status: Former Smoker   Smokeless tobacco: Never Used  Substance and Sexual Activity   Alcohol use: Yes    Comment: occasional   Drug use: Not Currently   Sexual activity: Yes  Other Topics Concern   Not on file  Social History Narrative   Not on file   Social Determinants of Health   Financial Resource Strain:    Difficulty of Paying Living Expenses: Not on file  Food Insecurity:    Worried About [place] in the Last Year: Not on file   [employer] of Food in the Last Year: Not on file  Transportation Needs:    Film/video editor (Medical): Not on file   Lack of Transportation (Non-Medical): Not on file  Physical Activity:    Days of Exercise per Week: Not on file   Minutes of Exercise per Session: Not on file  Stress:    Feeling of Stress : Not on file  Social Connections:    Frequency of Communication with Friends and Family: Not on file   Frequency of Social Gatherings with Friends and Family: Not on file   Attends Religious Services: Not on file   Active Member of Clubs or Organizations: Not on file   Attends Archivist Meetings: Not on file   Marital Status: Not on file  Intimate Partner Violence:    Fear of Current or Ex-Partner: Not on file   Emotionally Abused: Not on file   Physically Abused: Not on file   Sexually Abused: Not on file    Family History  Problem Relation Age of Onset   Alzheimer's disease Barry Alvarado    Heart attack Barry Alvarado     Anti-infectives: Anti-infectives (From admission,  onward)   Start     Dose/Rate Route Frequency Ordered Stop   02/05/20 1115  CIPROFLOXACIN HCL 500 MG PO TABS        500 mg Oral  Once 02/05/20 1104 02/05/20 1225      Current Outpatient Medications  Medication Sig Dispense Refill   amlodipine-atorvastatin (CADUET) 10-10 MG tablet Take 1 tablet by mouth daily.     budesonide-formoterol (SYMBICORT) 160-4.5 MCG/ACT inhaler Inhale 2 puffs into the lungs 2 (two) times daily.     clopidogrel (PLAVIX) 75 MG tablet Take 75 mg by mouth daily.     Dextran 70-Hypromellose 0.1-0.3 % SOLN Apply to eye.     gabapentin (NEURONTIN) 300 MG capsule Take 300 mg by mouth 3 (three) times daily.     hydrochlorothiazide (HYDRODIURIL) 50 MG tablet Take 50 mg by mouth daily.     lidocaine (LIDODERM) 5 % Place 1 patch onto the skin daily. Remove & Discard patch within 12 hours or as directed by MD     LORazepam (ATIVAN) 1 MG tablet Take 1 mg by mouth every 8 (eight) hours.     Multiple Vitamin (MULTIVITAMIN) tablet Take 1 tablet by mouth daily.     pantoprazole (PROTONIX) 20 MG tablet      sildenafil (VIAGRA) 100 MG tablet Take 100 mg by mouth daily as needed for erectile dysfunction.     tamsulosin (FLOMAX) 0.4 MG CAPS capsule      triamcinolone cream (KENALOG) 0.5 % Apply 1 application topically 3 (three) times daily.     No current facility-administered medications for this visit.     Objective: Vital signs in last 24 hours: BP (!) 152/75    Pulse 96    Temp 98.2 F (36.8 C)    Ht 6' (1.829 m)    Wt 212 lb (96.2 kg)    BMI 28.75 kg/m   Intake/Output from previous day: No intake/output data recorded. Intake/Output this shift: @IOTHISSHIFT @   Physical Exam  Lab Results:  Results for orders placed or performed in visit on 02/05/20 (from the past 24 hour(s))  Urinalysis, Routine w reflex microscopic     Status: Abnormal   Collection Time: 02/05/20 10:36 AM  Result Value Ref Range   Specific Gravity, UA 1.020 1.005 - 1.030   pH, UA  5.0 5.0 - 7.5   Color, UA Yellow Yellow   Appearance Ur Clear  Clear   Leukocytes,UA Negative Negative   Protein,UA 3+ (A) Negative/Trace   Glucose, UA Negative Negative   Ketones, UA Negative Negative   RBC, UA 1+ (A) Negative   Bilirubin, UA Negative Negative   Urobilinogen, Ur 0.2 0.2 - 1.0 mg/dL   Nitrite, UA Negative Negative   Microscopic Examination See below:    Narrative   Performed at:  [facility] [address] [place], [place]  [id number] Lab Director: Mina Marble MT, Phone:  [phone]  Microscopic Examination     Status: Abnormal   Collection Time: 02/05/20 10:36 AM   Urine  Result Value Ref Range   WBC, UA None seen 0 - 5 /hpf   RBC 3-10 (A) 0 - 2 /hpf   Epithelial Cells (non renal) None seen 0 - 10 /hpf   Renal Epithel, UA None seen None seen /hpf   Mucus, UA Present Not Estab.   Bacteria, UA None seen None seen/Few   Narrative   Performed at:  [facility] [address], [place], [place]  [id number] Lab Director: [place], Phone:  [phone]    BMET No results for input(s): NA, K, CL, CO2, GLUCOSE, BUN, CREATININE, CALCIUM in the last 72 hours. PT/INR No results for input(s): LABPROT, INR in the last 72 hours. ABG No results for input(s): PHART, HCO3 in the last 72 hours.  Invalid input(s): PCO2, PO2  Studies/Results: Procedure: Cystoscopy.  He was prepped with betadine and the urethra was instilled with 2% lidocaine jelly.  He was given 500mg  po of cipro.   The urethra was normal.  The prostate was 3-4cm with trilobar hyperplasia with obstruction.  The bladder had moderate trabeculation and there was a 1cm tumor on the left trigone.  The UO's were otherwise normal.      There were no complications.     Assessment/Plan: Prostate cancer.  He has T1c N0 M0 Gleason 8 prostate cancer in multiple cores and a single core with intraductal carcinoma.   I discuss options for therapy and feel that with his disease and  comorbidities that he will be best served with EXRT with 2 years of ADT.    I reviewed the risks and side effects of that option and will have him return soon to initiate ADT with firmagon.   I believe he will need to stay on firmagon because of his cardiovascular history.  I will send him to [facility] for consideration of radiation therapy.    Bladder cancer.  He has a small papillary tumor on the left trigone and will need a TURBT with instillation of gemcitabine.  I reviewed the risks of this procedure in detail and will get him cleared to come off of plavix for the procedure.  I also explained the need for surveillance cystoscopy going forward.    BPH with BOO.  He is doing better with tamsulosin and will continue that med.     Meds ordered this encounter  Medications   ciprofloxacin (CIPRO) tablet 500 mg     Orders Placed This Encounter  Procedures   Microscopic Examination   Urinalysis, Routine w reflex microscopic   Ambulatory referral to Radiation Oncology    Referral Priority:   Routine    Referral Type:   Consultation    Referral Reason:   Specialty Services Required    Referred to Provider:   Meda Klinefelter, MD    Requested Specialty:   Radiation Oncology    Number  of Visits Requested:   1     Return for He will need to be scheduled for cystoscopy with a TURBT.  F/U in 1-2 weeks to begin Firmagon 240mg .    CC: Dr. Jonny Ruiz.     Irine Seal 02/05/2020 [phone]

## 2020-02-05 ENCOUNTER — Encounter: Payer: Self-pay | Admitting: Urology

## 2020-02-05 ENCOUNTER — Ambulatory Visit (INDEPENDENT_AMBULATORY_CARE_PROVIDER_SITE_OTHER): Payer: Medicare HMO | Admitting: Urology

## 2020-02-05 ENCOUNTER — Other Ambulatory Visit: Payer: Self-pay

## 2020-02-05 VITALS — BP 152/75 | HR 96 | Temp 98.2°F | Ht 72.0 in | Wt 212.0 lb

## 2020-02-05 DIAGNOSIS — C61 Malignant neoplasm of prostate: Secondary | ICD-10-CM

## 2020-02-05 DIAGNOSIS — C670 Malignant neoplasm of trigone of bladder: Secondary | ICD-10-CM | POA: Diagnosis not present

## 2020-02-05 LAB — URINALYSIS, ROUTINE W REFLEX MICROSCOPIC
Bilirubin, UA: NEGATIVE
Glucose, UA: NEGATIVE
Ketones, UA: NEGATIVE
Leukocytes,UA: NEGATIVE
Nitrite, UA: NEGATIVE
Specific Gravity, UA: 1.02 (ref 1.005–1.030)
Urobilinogen, Ur: 0.2 mg/dL (ref 0.2–1.0)
pH, UA: 5 (ref 5.0–7.5)

## 2020-02-05 LAB — MICROSCOPIC EXAMINATION
Bacteria, UA: NONE SEEN
Epithelial Cells (non renal): NONE SEEN /hpf (ref 0–10)
Renal Epithel, UA: NONE SEEN /hpf
WBC, UA: NONE SEEN /hpf (ref 0–5)

## 2020-02-05 MED ORDER — CIPROFLOXACIN HCL 500 MG PO TABS
500.0000 mg | ORAL_TABLET | Freq: Once | ORAL | Status: AC
  Administered 2020-02-05: 500 mg via ORAL

## 2020-02-05 NOTE — Patient Instructions (Signed)
Prostate Cancer  The prostate is a walnut-sized gland that is involved in the production of semen. It is located below a man's bladder, in front of the rectum. Prostate cancer is the abnormal growth of cells in the prostate gland. What are the causes? The exact cause of this condition is not known. What increases the risk? This condition is more likely to develop in men who:  Are older than age 72.  Are African-American.  Are obese.  Have a family history of prostate cancer.  Have a family history of breast cancer. What are the signs or symptoms? Symptoms of this condition include:  A need to urinate often.  Weak or interrupted flow of urine.  Trouble starting or stopping urination.  Inability to urinate.  Pain or burning during urination.  Painful ejaculation.  Blood in urine or semen.  Persistent pain or discomfort in the lower back, lower abdomen, hips, or upper thighs.  Trouble getting an erection.  Trouble emptying the bladder all the way. How is this diagnosed? This condition can be diagnosed with:  A digital rectal exam. For this exam, a health care provider inserts a gloved finger into the rectum to feel the prostate gland.  A blood test called a prostate-specific antigen (PSA) test.  An imaging test called transrectal ultrasonography.  A procedure in which a sample of tissue is taken from the prostate and examined under a microscope (prostate biopsy). Once the condition is diagnosed, tests will be done to determine how far the cancer has spread. This is called staging the cancer. Staging may involve imaging tests, such as:  A bone scan.  A CT scan.  A PET scan.  An MRI. The stages of prostate cancer are as follows:  Stage I. At this stage, the cancer is found in the prostate only. The cancer is not visible on imaging tests and it is usually found by accident, such as during a prostate surgery.  Stage II. At this stage, the cancer is more advanced  than it is in stage I, but the cancer has not spread outside the prostate.  Stage III. At this stage, the cancer has spread beyond the outer layer of the prostate to nearby tissues. The cancer may be found in the seminal vesicles, which are near the bladder and the prostate.  Stage IV. At this stage, the cancer has spread other parts of the body, such as the lymph nodes, bones, bladder, rectum, liver, or lungs. How is this treated? Treatment for this condition depends on several factors, including the stage of the cancer, your age, personal preferences, and your overall health. Talk with your health care provider about treatment options that are recommended for you. Common treatments include:  Observation for early stage prostate cancer (active surveillance). This involves having exams, blood tests, and in some cases, more biopsies. For some men, this is the only treatment needed.  Surgery. Types of surgeries include: ? Open surgery. In this surgery, a larger incision is made to remove the prostate. ? A laparoscopic prostatectomy. This is a surgery to remove the prostate and lymph nodes through several, small incisions. It is often referred to as a minimally invasive surgery. ? A robotic prostatectomy. This is a surgery to remove the prostate and lymph nodes with the help of a robotic arm that is controlled by a computer. ? Orchiectomy. This is a surgery to remove the testicles. ? Cryosurgery. This is a surgery to freeze and destroy cancer cells.  Radiation treatment. Types   of radiation treatment include: ? External beam radiation. This type aims beams of radiation from outside the body at the prostate to destroy cancerous cells. ? Brachytherapy. This type uses radioactive needles, seeds, wires, or tubes that are implanted into the prostate gland. Like external beam radiation, brachytherapy destroys cancerous cells. An advantage is that this type of radiation limits the damage to surrounding  tissue and has fewer side effects.  High-intensity, focused ultrasonography. This treatment destroys cancer cells by delivering high-energy ultrasound waves to the cancerous cells.  Chemotherapy medicines. This treatment kills cancer cells or stops them from multiplying.  Hormone treatment. This treatment involves taking medicines that act on one of the male hormones (testosterone): ? By stopping your body from producing testosterone. ? By blocking testosterone from reaching cancer cells. Follow these instructions at home:  Take over-the-counter and prescription medicines only as told by your health care provider.  Maintain a healthy diet.  Get plenty of sleep.  Consider joining a support group for men who have prostate cancer. Meeting with a support group may help you learn to cope with the stress of having cancer.  Keep all follow-up visits as told by your health care provider. This is important.  If you have to go to the hospital, notify your cancer specialist (oncologist).  Treatment for prostate cancer may affect sexual function. Continue to have intimate moments with your partner. This may include touching, holding, hugging, and caressing. Contact a health care provider if:  You have trouble urinating.  You have blood in your urine.  You have pain in your hips, back, or chest. Get help right away if:  You have weakness or numbness in your legs.  You cannot control urination or your bowel movements (incontinence).  You have trouble breathing.  You have sudden chest pain.  You have chills or a fever. Summary  The prostate is a walnut-sized gland that is involved in the production of semen. It is located below a man's bladder, in front of the rectum. Prostate cancer is the abnormal growth of cells in the prostate gland.  Treatment for this condition depends on several factors, including the stage of the cancer, your age, personal preferences, and your overall health.  Talk with your health care provider about treatment options that are recommended for you.  Consider joining a support group for men who have prostate cancer. Meeting with a support group may help you learn to cope with the stress of having cancer. This information is not intended to replace advice given to you by your health care provider. Make sure you discuss any questions you have with your health care provider. Document Revised: 03/22/2017 Document Reviewed: 12/19/2015 Elsevier Patient Education  2020 [place] Therapy for Prostate Cancer  Hormone suppression therapy is a treatment that can help to slow the growth of cancer cells in the prostate (prostate gland). It is also called androgen deprivation therapy (ADT). Hormone suppression therapy targets hormones in the body called androgens that may help cancer cells grow. This therapy reduces the amount of these hormones in the body or keeps the body from using them. Hormone suppression therapy alone will not cure prostate cancer, but it can slow the growth of prostate cancer and may shrink tumors over time. Your health care provider can help you find the most effective way to treat your type of prostate cancer and best fit your lifestyle. Types of hormone suppression therapy Orchiectomy Orchiectomy, also called surgical castration, is a surgery to remove  one or both testicles. The testicles are where the two main androgens (testosterone and dihydrotestosterone) are made. Medicine therapy Medicine therapy, also called chemical castration, involves taking medicines to keep your body from making or using androgens. If you choose this therapy, you may take any of these medicines:  Luteinizing hormone-releasing hormone [facility]) agonist medicines. These medicines are injected or implanted under your skin to lower the amount of androgens that your testicles make. If you take these medicines, you may also be prescribed other  medicines to help with side effects. Common LHRH agonist medicines include: ? Leuprolide. ? Goserelin. ? Histrelin. ? Triptorelin.  LHRH antagonist medicines. These medicines are like LHRH agonist medicines but they work faster. They are commonly used to prevent side effects when prostate cancer is in an advanced stage. A common LHRH antagonist medicine is degarelix.  CYP17 inhibitor medicines. These medicines help to stop other glands from making androgens. They may be used if the prostate cancer is advanced and has not gotten better with surgery or other medicine treatment. A steroid medicine may be given with this type of medicine to help with side effects. A common CYP17 inhibitor medicine is abiraterone.  Anti-androgen medicines Anti-androgen medicines block areas on the body where androgens attach. This prevents the androgens from coming into contact with cancer cells and fueling their growth. These medicines include:  Flutamide.  Bicalutamide.  Enzalutamide.  Nilutamide. Androgen-suppressing medicines Androgen-suppressing medicines may be used if other hormone suppression treatments are not working. They are not commonly used, however, because of their side effects. These medicines include:  Estrogen.  Ketoconazole. What are the risks? Hormone suppression therapy may cause side effects, including:  Hot flashes.  A decrease or lack of sexual desire.  Erectile dysfunction (impotence).  A decrease in the size of the penis or testicles.  Breast tenderness.  An increase in breast size.  Fatigue.  Weight gain.  Thinning of the bones (osteoporosis).  Anemia.  Loss of muscle.  Depression.  Increased cholesterol levels.  Trouble with thinking or focusing.  Stomach upset and nausea.  Diarrhea. Hormone suppression therapy can also increase your risk of these problems:  High blood pressure (hypertension).  Stroke.  Diabetes.  Heart attack.  Heart  disease. What are the benefits? One of the main benefits of hormone suppression therapy is having additional treatment options. You may have only one type of treatment or two or more types at the same time. Treatments may be combined to:  Help with side effects.  Treat advanced cancer.  Avoid treatments that may cause physical or emotional distress. Hormone suppression therapy may be used during any stage of prostate cancer treatment. Contact a health care provider if:  You have pain or side effects that do not get better with treatment.  You have trouble urinating.  You have new side effects that do not go away. Get help right away if:  You have severe chest pain.  You have trouble breathing.  You have an irregular heartbeat.  You have numbness or paralysis in the lower half of your body.  You are confused.  You have trouble talking or understanding. These symptoms may be an emergency. Do not wait to see if the symptoms will go away. Get medical help right away. Call your local emergency services (911 in the [place] Do not drive yourself to the hospital. Summary  Hormone suppression therapy is a treatment that can help to slow the growth of cancer cells in the prostate (prostate gland).  Hormone suppression therapy alone will not cure prostate cancer, but it can slow the growth of prostate cancer and may shrink tumors over time.  Treatment to suppress hormones may include surgery or medicines. This information is not intended to replace advice given to you by your health care provider. Make sure you discuss any questions you have with your health care provider. Document Revised: 04/20/2016 Document Reviewed: 03/14/2016 Elsevier Patient Education  2020 [place]. Bladder Cancer  Bladder cancer is an abnormal growth of tissue in the bladder. The bladder is the balloon-like sac in the pelvis. It collects and stores urine that comes from the kidneys through the ureters. The  bladder wall is made of layers. If cancer spreads into these layers and through the wall of the bladder, it becomes more difficult to treat. What are the causes? The cause of this condition is not known. What increases the risk? The following factors may make you more likely to develop this condition:  Smoking.  Workplace risks (occupational exposures), such as rubber, leather, textile, dyes, chemicals, and paint.  Being white.  Your age. Most people with bladder cancer are over the age of 33.  Being male.  Having chronic bladder inflammation.  Having a personal history of bladder cancer.  Having a family history of bladder cancer (heredity).  Having had chemotherapy or radiation therapy to the pelvis.  Having been exposed to arsenic. What are the signs or symptoms? Initial symptoms of this condition include:  Blood in the urine.  Painful urination.  Frequent bladder or urine infections.  Increase in urgency and frequency of urination. Advanced symptoms of this condition include:  Not being able to urinate.  Low back pain on one side.  Loss of appetite.  Weight loss.  Fatigue.  Swelling in the feet.  Bone pain. How is this diagnosed? This condition is diagnosed based on your medical history, a physical exam, urine tests, lab tests, imaging tests, and your symptoms. You may also have other tests or procedures done, such as:  A narrow tube being inserted into your bladder through your urethra (cystoscopy) in order to view the lining of your bladder for tumors.  A biopsy to sample the tumor to see if cancer is present. If cancer is present, it will then be staged to determine its severity and extent. Staging is an assessment of:  The size of the tumor.  Whether the cancer has spread.  Where the cancer has spread. It is important to know how deeply into the bladder wall cancer has grown and whether cancer has spread to any other parts of your body. Staging  may require blood tests or imaging tests, such as a CT scan, MRI, bone scan, or chest X-ray. How is this treated? Based on the stage of cancer, one treatment or a combination of treatments may be recommended. The most common forms of treatment are:  Surgery to remove the cancer. Procedures that may be done include transurethral resection and cystectomy.  Radiation therapy. This is high-energy X-rays or other particles. This is often used in combination with chemotherapy.  Chemotherapy. During this treatment, medicines are used to kill cancer cells.  Immunotherapy. This uses medicines to help your own immune system destroy cancer cells. Follow these instructions at home:  Take over-the-counter and prescription medicines only as told by your health care provider.  Maintain a healthy diet. Some of your treatments might affect your appetite.  Consider joining a support group. This may help you learn to cope  with the stress of having bladder cancer.  Tell your cancer care team if you develop side effects. They may be able to recommend ways to relieve them.  Keep all follow-up visits as told by your health care provider. This is important. Where to find more information  American Cancer Society: www.cancer.[place] www.cancer.gov Contact a health care provider if:  You have symptoms of a urinary tract infection. These include: ? Fever. ? Chills. ? Weakness. ? Muscle aches. ? Abdominal pain. ? Frequent and intense urge to urinate. ? Burning feeling in the bladder or urethra during urination. Get help right away if:  There is blood in your urine.  You cannot urinate.  You have severe pain or other symptoms that do not go away. Summary  Bladder cancer is an abnormal growth of tissue in the bladder.  This condition is diagnosed based on your medical history, a physical exam, urine tests, lab tests, imaging tests, and your symptoms.  Based on the  stage of cancer, surgery, chemotherapy, or a combination of treatments may be recommended.  Consider joining a support group. This may help you learn to cope with the stress of having bladder cancer. This information is not intended to replace advice given to you by your health care provider. Make sure you discuss any questions you have with your health care provider. Document Revised: 03/22/2017 Document Reviewed: 03/13/2016 Elsevier Patient Education  2020 [employer].

## 2020-02-05 NOTE — Progress Notes (Signed)
Urological Symptom Review  Patient is experiencing the following symptoms: Hard to postpone urination Get up at night to urinate   Review of Systems  Gastrointestinal (upper)  : Indigestion/heartburn  Gastrointestinal (lower) : Diarrhea  Constitutional : Weight loss Fatigue  Skin: Skin rash/lesion  Eyes: Blurred vision Double vision  Ear/Nose/Throat : Sinus problems  Hematologic/Lymphatic: Easy bruising  Cardiovascular : Negative for cardiovascular symptoms  Respiratory : Shortness of breath  Endocrine: Negative for endocrine symptoms  Musculoskeletal: Joint pain  Neurological: Dizziness  Psychologic: Depression Anxiety

## 2020-02-10 ENCOUNTER — Other Ambulatory Visit: Payer: Self-pay | Admitting: Urology

## 2020-02-11 NOTE — Patient Instructions (Addendum)
DUE TO COVID-19 ONLY ONE VISITOR IS ALLOWED TO COME WITH YOU AND STAY IN THE WAITING ROOM ONLY DURING PRE OP AND PROCEDURE DAY OF SURGERY. THE 1 VISITOR  MAY VISIT WITH YOU AFTER SURGERY IN YOUR PRIVATE ROOM DURING VISITING HOURS ONLY!  YOU NEED TO HAVE A COVID 19 TEST ON_11/1______ @__1 :00p_____, THIS TEST MUST BE DONE BEFORE SURGERY,  COVID TESTING SITE [place] [id number], IT IS ON THE RIGHT GOING OUT [address] APPROXIMATELY  2 MINUTES PAST [employer] ON THE RIGHT. ONCE YOUR COVID TEST IS COMPLETED,  PLEASE BEGIN THE QUARANTINE INSTRUCTIONS AS OUTLINED IN YOUR HANDOUT.                Barry Alvarado    Your procedure is scheduled on: 02/25/20   Report to [facility] Main  Entrance   Report to admitting at  7:30 AM     Call this number if you have problems the morning of surgery [phone]    Remember: Do not eat food or drink liquids :After Midnight.   BRUSH YOUR TEETH MORNING OF SURGERY AND RINSE YOUR MOUTH OUT, NO CHEWING GUM CANDY OR MINTS.     Take these medicines the morning of surgery with A SIP OF WATER: Gabapentin, Tamsulosin, Pantoprozole,             use your inhaler and bring it with you to the hospital                                 You may not have any metal on your body including             piercings  Do not wear jewelry,  lotions, powders or deodorant                        Men may shave face and neck.   Do not bring valuables to the hospital. [place].  Contacts, dentures or bridgework may not be worn into surgery.      Patients discharged the day of surgery will not be allowed to drive home  . IF YOU ARE HAVING SURGERY AND GOING HOME THE SAME DAY, YOU MUST HAVE AN ADULT TO DRIVE YOU HOME AND BE WITH YOU FOR 24 HOURS.   YOU MAY GO HOME BY TAXI OR UBER OR ORTHERWISE, BUT AN ADULT MUST ACCOMPANY YOU HOME AND STAY WITH YOU FOR 24 HOURS.  Name and phone number  of your driver:  Special Instructions: N/A              Please read over the following fact sheets you were given: _____________________________________________________________________             [facility] - Preparing for Surgery Before surgery, you can play an important role.  Because skin is not sterile, your skin needs to be as free of germs as possible.  You can reduce the number of germs on your skin by washing with CHG (chlorahexidine gluconate) soap before surgery.  CHG is an antiseptic cleaner which kills germs and bonds with the skin to continue killing germs even after washing. Please DO NOT use if you have an allergy to CHG or antibacterial soaps.  If your skin becomes reddened/irritated stop using the CHG  and inform your nurse when you arrive at Short Stay. Do not shave (including legs and underarms) for at least 48 hours prior to the first CHG shower.  You may shave your face/neck. Please follow these instructions carefully:  1.  Shower with CHG Soap the night before surgery and the  morning of Surgery.  2.  If you choose to wash your hair, wash your hair first as usual with your  normal  shampoo.  3.  After you shampoo, rinse your hair and body thoroughly to remove the  shampoo.                                        4.  Use CHG as you would any other liquid soap.  You can apply chg directly  to the skin and wash                       Gently with a scrungie or clean washcloth.  5.  Apply the CHG Soap to your body ONLY FROM THE NECK DOWN.   Do not use on face/ open                           Wound or open sores. Avoid contact with eyes, ears mouth and genitals (private parts).                       Wash face,  Genitals (private parts) with your normal soap.             6.  Wash thoroughly, paying special attention to the area where your surgery  will be performed.  7.  Thoroughly rinse your body with warm water from the neck down.  8.  DO NOT shower/wash with your normal soap  after using and rinsing off  the CHG Soap.                9.  Pat yourself dry with a clean towel.            10.  Wear clean pajamas.            11.  Place clean sheets on your bed the night of your first shower and do not  sleep with pets. Day of Surgery : Do not apply any lotions/deodorants the morning of surgery.  Please wear clean clothes to the hospital/surgery center.  FAILURE TO FOLLOW THESE INSTRUCTIONS MAY RESULT IN THE CANCELLATION OF YOUR SURGERY PATIENT SIGNATURE_________________________________  NURSE SIGNATURE__________________________________  ________________________________________________________________________

## 2020-02-15 ENCOUNTER — Encounter (HOSPITAL_COMMUNITY): Payer: Self-pay

## 2020-02-15 ENCOUNTER — Other Ambulatory Visit: Payer: Self-pay

## 2020-02-15 ENCOUNTER — Encounter (HOSPITAL_COMMUNITY)
Admission: RE | Admit: 2020-02-15 | Discharge: 2020-02-15 | Disposition: A | Discharge status: Home / Self Care | Payer: Medicare HMO | Source: Ambulatory Visit | Attending: Urology | Admitting: Urology

## 2020-02-15 DIAGNOSIS — Z01818 Encounter for other preprocedural examination: Secondary | ICD-10-CM | POA: Diagnosis not present

## 2020-02-15 DIAGNOSIS — D494 Neoplasm of unspecified behavior of bladder: Secondary | ICD-10-CM | POA: Diagnosis not present

## 2020-02-15 DIAGNOSIS — I10 Essential (primary) hypertension: Secondary | ICD-10-CM | POA: Insufficient documentation

## 2020-02-15 HISTORY — DX: Anxiety disorder, unspecified: F41.9

## 2020-02-15 LAB — BASIC METABOLIC PANEL
Anion gap: 9 (ref 5–15)
BUN: 17 mg/dL (ref 8–23)
CO2: 25 mmol/L (ref 22–32)
Calcium: 9 mg/dL (ref 8.9–10.3)
Chloride: 107 mmol/L (ref 98–111)
Creatinine, Ser: 1.1 mg/dL (ref 0.61–1.24)
GFR, Estimated: >60 mL/min (ref >60)
Glucose, Bld: 91 mg/dL (ref 70–99)
Potassium: 4.3 mmol/L (ref 3.5–5.1)
Sodium: 141 mmol/L (ref 135–145)

## 2020-02-15 LAB — CBC
HCT: 44.8 % (ref 39.0–52.0)
Hemoglobin: 14.8 g/dL (ref 13.0–17.0)
MCH: 30.2 pg (ref 26.0–34.0)
MCHC: 33 g/dL (ref 30.0–36.0)
MCV: 91.4 fL (ref 80.0–100.0)
Platelets: 169 10*3/uL (ref 150–400)
RBC: 4.9 MIL/uL (ref 4.22–5.81)
RDW: 13.4 % (ref 11.5–15.5)
WBC: 8 10*3/uL (ref 4.0–10.5)
nRBC: 0 % (ref 0.0–0.2)

## 2020-02-15 NOTE — Progress Notes (Signed)
COVID Vaccine Completed:yes Date COVID Vaccine completed:08/10/19 COVID vaccine manufacturer: Moderna     PCP - Dr. Thurnell Lose Cardiologist - none  Chest x-ray - no EKG - 02/15/20 Stress Test - no ECHO - no Cardiac Cath -  Pacemaker/ICD device last checked:NA  Sleep Study - no CPAP -   Fasting Blood Sugar - NA Checks Blood Sugar _____ times a day  Blood Thinner Instructions:Plavix/ Dr. Herbert Pun Aspirin Instructions:none received. Pt's sister will call MD today. They have an appointment on 10/28 Last Dose:  Anesthesia review:   Patient denies shortness of breath, fever, cough and chest pain at PAT appointment yes   Patient verbalized understanding of instructions that were given to them at the PAT appointment. Patient was also instructed that they will need to review over the PAT instructions again at home before surgery. 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 He is able to care for himself  But has weakness on the Lt side.  He reports that he had a cardiac cath with stents in 2011 at [facility]. He has COPD and uses his inhaler every day.

## 2020-02-18 ENCOUNTER — Other Ambulatory Visit: Payer: Self-pay

## 2020-02-18 ENCOUNTER — Ambulatory Visit (INDEPENDENT_AMBULATORY_CARE_PROVIDER_SITE_OTHER): Payer: Medicare HMO

## 2020-02-18 ENCOUNTER — Encounter (HOSPITAL_COMMUNITY): Payer: Self-pay

## 2020-02-18 DIAGNOSIS — C61 Malignant neoplasm of prostate: Secondary | ICD-10-CM | POA: Diagnosis not present

## 2020-02-18 MED ORDER — DEGARELIX ACETATE(240 MG DOSE) 120 MG/VIAL ~~LOC~~ SOLR
240.0000 mg | Freq: Once | SUBCUTANEOUS | Status: AC
  Administered 2020-02-18: 240 mg via SUBCUTANEOUS

## 2020-02-18 NOTE — Progress Notes (Signed)
Firmagon Sub Q Injection  Due to Prostate Cancer patient is present today for a Firmagon Injection.   Medication: Mills Koller (Degarelix)  Dose: 240mg  Location: right & left upper abdomen Lot: [id number] Exp: 08/2021  Patient tolerated well, no complications were noted  Performed by: Charnelle Bergeman, LPN  Follow up: Keep next scheduled OV

## 2020-02-18 NOTE — Progress Notes (Signed)
Anesthesia Chart Review:   Case: [id number] Date/Time: 02/25/20 0915   Procedure: TRANSURETHRAL RESECTION OF BLADDER TUMOR WITH GEMCITABINE (N/A )   Anesthesia type: General   Pre-op diagnosis: bladder tumor   Location: [facility]   Surgeons: Irine Seal, MD      DISCUSSION:  Pt is a 74 year old with hx CAD (stent ~2011), CVA (x5; most recent in 2018), COPD  Pt reports hx CAD, cardiac stenting about 10 years ago. Does not see cardiology. Has not had testing of cardiac function since then. Denies chest pain; reports shortness of breath when he exerts himself. Records from PCP's office indicate PCP does not know about pt's cardiac history. Discussed with Dr. Valma Cava. Pt will need PCP to evaluate cardiac function prior to surgery.   I spoke with nurse Magda Paganini [phone]) in PCP's office; she will add CAD/stent to pt's PMH in their records and arrange for pt to be seen by PCP and evaluated for cardiac fitness for surgery prior to surgery. I notified Pam in Dr. Ralene Muskrat office of situation.     VS: BP 135/75   Pulse 73   Temp 36.5 C (Oral)   Resp 18   Ht 6' (1.829 m)   Wt 94.8 kg   SpO2 100%   BMI 28.35 kg/m    PROVIDERS: - PCP is Nsumanganyi, Ferdinand Lango, NP. Last office visit 02/11/20. PCP aware of upcoming surgery.    LABS: Labs reviewed: Acceptable for surgery. (all labs ordered are listed, but only abnormal results are displayed)  Labs Reviewed  BASIC METABOLIC PANEL  CBC    EKG 02/15/20: NSR   CV: none  Past Medical History:  Diagnosis Date  . Anxiety   . CAD (coronary artery disease)    pt reports stent placed ~2011  . Cancer [facility])    Prostate, bladder  . COPD (chronic obstructive pulmonary disease) ([place])   . CVA (cerebral vascular accident) ([place]) 2017   He has had 5 strokes.   . Erectile dysfunction due to arterial insufficiency   . GERD (gastroesophageal reflux disease)   . Hypertension     Past Surgical History:  Procedure  Laterality Date  . CARDIAC CATHETERIZATION  2011   with stent  . CHOLECYSTECTOMY  4-5 yrs ago  . CYSTOSCOPY  01/29/2020   bladder    MEDICATIONS: . amlodipine-atorvastatin (CADUET) 10-10 MG tablet  . budesonide-formoterol (SYMBICORT) 160-4.5 MCG/ACT inhaler  . clopidogrel (PLAVIX) 75 MG tablet  . Dextran 70-Hypromellose 0.1-0.3 % SOLN  . gabapentin (NEURONTIN) 300 MG capsule  . hydrochlorothiazide (HYDRODIURIL) 50 MG tablet  . HYDROcodone-acetaminophen (NORCO/VICODIN) 5-325 MG tablet  . lidocaine (LIDODERM) 5 %  . LORazepam (ATIVAN) 1 MG tablet  . Multiple Vitamin (MULTIVITAMIN) tablet  . omega-3 acid ethyl esters (LOVAZA) 1 g capsule  . pantoprazole (PROTONIX) 20 MG tablet  . sildenafil (VIAGRA) 100 MG tablet  . tamsulosin (FLOMAX) 0.4 MG CAPS capsule  . triamcinolone cream (KENALOG) 0.5 %   No current facility-administered medications for this encounter.   Willeen Cass, PhD, FNP-BC [facility]/Anesthesiology Phone: [phone] 02/18/2020 4:01 PM

## 2020-02-19 ENCOUNTER — Telehealth: Payer: Self-pay

## 2020-02-19 NOTE — Telephone Encounter (Signed)
Pt called saying the places where he had his injections yesterday were red, swollen, ans febrile feeling. Pt was referred to pamphlet given to him yesterday by nurse. He also was told this is normal and could last for up to a month.

## 2020-02-22 ENCOUNTER — Other Ambulatory Visit (HOSPITAL_COMMUNITY)
Admission: RE | Admit: 2020-02-22 | Discharge: 2020-02-22 | Disposition: A | Discharge status: Home / Self Care | Payer: Medicare HMO | Source: Ambulatory Visit | Attending: Urology | Admitting: Urology

## 2020-02-22 DIAGNOSIS — Z01818 Encounter for other preprocedural examination: Secondary | ICD-10-CM | POA: Diagnosis present

## 2020-02-22 DIAGNOSIS — Z20822 Contact with and (suspected) exposure to covid-19: Secondary | ICD-10-CM | POA: Diagnosis not present

## 2020-02-23 LAB — SARS CORONAVIRUS 2 (TAT 6-24 HRS): SARS Coronavirus 2: NEGATIVE

## 2020-02-25 ENCOUNTER — Encounter (HOSPITAL_COMMUNITY)
Admission: RE | Disposition: A | Discharge status: Home / Self Care | Payer: Self-pay | Source: Home / Self Care | Attending: Urology

## 2020-02-25 ENCOUNTER — Encounter (HOSPITAL_COMMUNITY): Payer: Self-pay | Admitting: Urology

## 2020-02-25 ENCOUNTER — Ambulatory Visit (HOSPITAL_COMMUNITY): Payer: Medicare HMO | Admitting: Certified Registered"

## 2020-02-25 ENCOUNTER — Ambulatory Visit (HOSPITAL_COMMUNITY)
Admission: RE | Admit: 2020-02-25 | Discharge: 2020-02-25 | Disposition: A | Discharge status: Home / Self Care | Payer: Medicare HMO | Attending: Urology | Admitting: Urology

## 2020-02-25 ENCOUNTER — Ambulatory Visit (HOSPITAL_COMMUNITY): Payer: Medicare HMO | Admitting: Emergency Medicine

## 2020-02-25 DIAGNOSIS — C61 Malignant neoplasm of prostate: Secondary | ICD-10-CM | POA: Insufficient documentation

## 2020-02-25 DIAGNOSIS — C670 Malignant neoplasm of trigone of bladder: Secondary | ICD-10-CM | POA: Diagnosis not present

## 2020-02-25 DIAGNOSIS — D494 Neoplasm of unspecified behavior of bladder: Secondary | ICD-10-CM | POA: Insufficient documentation

## 2020-02-25 HISTORY — PX: TRANSURETHRAL RESECTION OF BLADDER TUMOR WITH MITOMYCIN-C: SHX6459

## 2020-02-25 SURGERY — TRANSURETHRAL RESECTION OF BLADDER TUMOR WITH MITOMYCIN-C
Anesthesia: General

## 2020-02-25 MED ORDER — CIPROFLOXACIN IN D5W 400 MG/200ML IV SOLN
400.0000 mg | INTRAVENOUS | Status: AC
  Administered 2020-02-25: 400 mg via INTRAVENOUS
  Filled 2020-02-25: qty 200

## 2020-02-25 MED ORDER — GEMCITABINE CHEMO FOR BLADDER INSTILLATION 2000 MG
2000.0000 mg | Freq: Once | INTRAVENOUS | Status: AC
  Administered 2020-02-25: 2000 mg via INTRAVESICAL
  Filled 2020-02-25: qty 2000

## 2020-02-25 MED ORDER — FENTANYL CITRATE (PF) 100 MCG/2ML IJ SOLN
INTRAMUSCULAR | Status: AC
  Filled 2020-02-25: qty 2

## 2020-02-25 MED ORDER — FENTANYL CITRATE (PF) 100 MCG/2ML IJ SOLN: 25.0000 ug | INTRAMUSCULAR | Status: DC | PRN

## 2020-02-25 MED ORDER — LIDOCAINE 2% (20 MG/ML) 5 ML SYRINGE
INTRAMUSCULAR | Status: DC | PRN
  Administered 2020-02-25: 20 mg via INTRAVENOUS

## 2020-02-25 MED ORDER — ORAL CARE MOUTH RINSE: 15.0000 mL | Freq: Once | OROMUCOSAL | Status: AC

## 2020-02-25 MED ORDER — LIDOCAINE HCL URETHRAL/MUCOSAL 2 % EX GEL
CUTANEOUS | Status: AC
  Filled 2020-02-25: qty 60

## 2020-02-25 MED ORDER — LACTATED RINGERS IV SOLN: INTRAVENOUS | Status: DC

## 2020-02-25 MED ORDER — SODIUM CHLORIDE 0.9 % IR SOLN
Status: DC | PRN
  Administered 2020-02-25 (×2): 3000 mL

## 2020-02-25 MED ORDER — SODIUM CHLORIDE 0.9% FLUSH: 3.0000 mL | Freq: Two times a day (BID) | INTRAVENOUS | Status: DC

## 2020-02-25 MED ORDER — MIDAZOLAM HCL 5 MG/5ML IJ SOLN
INTRAMUSCULAR | Status: DC | PRN
  Administered 2020-02-25: 2 mg via INTRAVENOUS

## 2020-02-25 MED ORDER — ONDANSETRON HCL 4 MG/2ML IJ SOLN
INTRAMUSCULAR | Status: DC | PRN
  Administered 2020-02-25: 4 mg via INTRAVENOUS

## 2020-02-25 MED ORDER — MIDAZOLAM HCL 2 MG/2ML IJ SOLN
INTRAMUSCULAR | Status: AC
  Filled 2020-02-25: qty 2

## 2020-02-25 MED ORDER — PROPOFOL 10 MG/ML IV BOLUS
INTRAVENOUS | Status: DC | PRN
  Administered 2020-02-25: 150 mg via INTRAVENOUS

## 2020-02-25 MED ORDER — FENTANYL CITRATE (PF) 100 MCG/2ML IJ SOLN
INTRAMUSCULAR | Status: DC | PRN
Start: 2020-02-25 — End: 2020-02-25
  Administered 2020-02-25 (×2): 50 ug via INTRAVENOUS

## 2020-02-25 MED ORDER — DEXAMETHASONE SODIUM PHOSPHATE 10 MG/ML IJ SOLN
INTRAMUSCULAR | Status: DC | PRN
  Administered 2020-02-25: 10 mg via INTRAVENOUS

## 2020-02-25 MED ORDER — CHLORHEXIDINE GLUCONATE 0.12 % MT SOLN
15.0000 mL | Freq: Once | OROMUCOSAL | Status: AC
  Administered 2020-02-25: 15 mL via OROMUCOSAL

## 2020-02-25 MED ORDER — GEMCITABINE CHEMO FOR BLADDER INSTILLATION 2000 MG: 2000.0000 mg | Freq: Once | INTRAVENOUS | Status: DC

## 2020-02-25 MED ORDER — PROPOFOL 10 MG/ML IV BOLUS
INTRAVENOUS | Status: AC
  Filled 2020-02-25: qty 20

## 2020-02-25 SURGICAL SUPPLY — 20 items
BAG URINE DRAIN 2000ML AR STRL (UROLOGICAL SUPPLIES) ×3 IMPLANT
BAG URO CATCHER STRL LF (MISCELLANEOUS) ×3 IMPLANT
CATH FOLEY 2WAY SLVR  5CC 20FR (CATHETERS) ×2
CATH FOLEY 2WAY SLVR 5CC 20FR (CATHETERS) ×1 IMPLANT
CATH FOLEY 3WAY 30CC 22FR (CATHETERS) IMPLANT
CATH URET 5FR 28IN OPEN ENDED (CATHETERS) IMPLANT
GLOVE SURG SS PI 8.0 STRL IVOR (GLOVE) IMPLANT
GOWN STRL REUS W/TWL XL LVL3 (GOWN DISPOSABLE) ×3 IMPLANT
HOLDER FOLEY CATH W/STRAP (MISCELLANEOUS) IMPLANT
KIT TURNOVER KIT A (KITS) IMPLANT
LOOP CUT BIPOLAR 24F LRG (ELECTROSURGICAL) ×3 IMPLANT
MANIFOLD NEPTUNE II (INSTRUMENTS) ×3 IMPLANT
PACK CYSTO (CUSTOM PROCEDURE TRAY) ×3 IMPLANT
SUT ETHILON 3 0 PS 1 (SUTURE) IMPLANT
SYR 30ML LL (SYRINGE) IMPLANT
SYR TOOMEY IRRIG 70ML (MISCELLANEOUS)
SYRINGE TOOMEY IRRIG 70ML (MISCELLANEOUS) IMPLANT
TUBING CONNECTING 10 (TUBING) ×2 IMPLANT
TUBING CONNECTING 10' (TUBING) ×1
TUBING UROLOGY SET (TUBING) ×3 IMPLANT

## 2020-02-25 NOTE — Anesthesia Postprocedure Evaluation (Signed)
Anesthesia Post Note  Patient: Barry Alvarado  Procedure(s) Performed: TRANSURETHRAL RESECTION OF BLADDER TUMOR WITH GEMCITABINE (N/A )     Patient location during evaluation: PACU Anesthesia Type: General Level of consciousness: awake and alert Pain management: pain level controlled Vital Signs Assessment: post-procedure vital signs reviewed and stable Respiratory status: spontaneous breathing, nonlabored ventilation, respiratory function stable and patient connected to nasal cannula oxygen Cardiovascular status: blood pressure returned to baseline and stable Postop Assessment: no apparent nausea or vomiting Anesthetic complications: no   No complications documented.  Last Vitals:  Vitals:   02/25/20 1130 02/25/20 1145  BP: (!) 160/86 (!) 162/94  Pulse: 67 61  Resp: 18 (!) 23  Temp:    SpO2: 95% 94%    Last Pain:  Vitals:   02/25/20 1145  TempSrc:   PainSc: 0-No pain                 Effie Berkshire

## 2020-02-25 NOTE — Anesthesia Preprocedure Evaluation (Addendum)
Anesthesia Evaluation  Patient identified by MRN, date of birth, ID band Patient awake    Reviewed: Allergy & Precautions, NPO status , Patient's Chart, lab work & pertinent test results  Airway Mallampati: I  TM Distance: >3 FB Neck ROM: Full    Dental  (+) Missing, Poor Dentition, Chipped,    Pulmonary COPD, former smoker,     + decreased breath sounds      Cardiovascular hypertension, Pt. on medications + CAD   Rhythm:Regular Rate:Normal     Neuro/Psych Anxiety CVA, Residual Symptoms    GI/Hepatic Neg liver ROS, GERD  Medicated,  Endo/Other  negative endocrine ROS  Renal/GU negative Renal ROS     Musculoskeletal negative musculoskeletal ROS (+)   Abdominal Normal abdominal exam  (+)   Peds  Hematology negative hematology ROS (+)   Anesthesia Other Findings   Reproductive/Obstetrics                            Anesthesia Physical Anesthesia Plan  ASA: III  Anesthesia Plan: General   Post-op Pain Management:    Induction: Intravenous  PONV Risk Score and Plan: 3 and Ondansetron, Dexamethasone and Midazolam  Airway Management Planned: LMA  Additional Equipment: None  Intra-op Plan:   Post-operative Plan: Extubation in OR  Informed Consent: I have reviewed the patients History and Physical, chart, labs and discussed the procedure including the risks, benefits and alternatives for the proposed anesthesia with the patient or authorized representative who has indicated his/her understanding and acceptance.       Plan Discussed with: CRNA  Anesthesia Plan Comments:        Anesthesia Quick Evaluation

## 2020-02-25 NOTE — Op Note (Signed)
Procedure: 1.  Cystoscopy with transurethral resection of 2.5 cm left trigone bladder tumor. 2.  Instillation of gemcitabine in PACU.  Preop diagnosis: Bladder tumor of left trigone.  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  Surgeon: Dr. Irine Seal.  Anesthesia: General.  Specimen: Bladder tumor chips.  Drains: 20 French Foley catheter.  EBL: None.  Complications: None.  Indications: Barry Alvarado is a 74 year old male who was found on evaluation for hematuria to have a papillary lesion lateral to the left ureteral orifice consistent with a urothelial neoplasm.  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  Procedure: He was given Cipro.  He was taken operating room where general anesthetic was induced.  Was placed in lithotomy position and fitted with PAS hose.  His perineum and genitalia were prepped with Betadine solution he was draped in usual sterile fashion.  Cystoscopy was performed using a 23 [place] scope and 30 degree lens.  Examination revealed a normal urethra.  The external sphincter was intact.  The prostatic urethra was approximately 3 cm in length with bilobar hyperplasia with some obstruction.  Examination of bladder revealed moderate trabeculation.  There was a papillary lesion approximately 1-1/2 to 2 cm lateral to the left ureteral orifice on the trigonal ridge that was approximately 2 cm in size but there was a satellite lesion approximately half centimeter lateral which gave the tumor patchy diameter of approximately 2-1/2 cm.  There were couple very small erythematous areas on the left posterior wall as well but they did not have papillary changes.  Ureteral orifices were unremarkable.  The 99 French continuous-flow resectoscope sheath was placed the aid of visual obturator and then fitted with an [place] handle with a bipolar loop.  Saline was used as the irrigant.  The  lesion was then resected into the muscle and after the primary lesion was resected there was some mucosal changes superior lateral that were suspicious for more of a flat tumor this was also resected and the tumor bed and surrounding mucosa was generously fulgurated.  The 2 small erythematous lesions on the left posterior wall were also fulgurated.  The bladder was evacuated free of chips and final hemostasis was assured.  The scope was removed and a 20 [place] Foley catheter was inserted.  The balloon was filled with 10 mL of sterile water.  The catheter was placed to straight drainage.  He was taken down from lithotomy position, his anesthetic was reversed and he was moved recovery in stable condition.  In the recovery room his bladder was instilled with 2000 mg of gemcitabine and 50 mL of diluent.  This was left indwelling for 1 hour prior to drainage.  There were no complications.

## 2020-02-25 NOTE — Discharge Instructions (Addendum)
ITransurethral Resection of Bladder Tumor, Care After This sheet gives you information about how to care for yourself after your procedure. Your health care provider may also give you more specific instructions. If you have problems or questions, contact your health care provider. What can I expect after the procedure? After the procedure, it is common to have:  A small amount of blood in your urine for up to 2 weeks.  Soreness or mild pain from your catheter. After your catheter is removed, you may have mild soreness, especially when urinating.  Pain in your lower abdomen. Follow these instructions at home: Medicines   Take over-the-counter and prescription medicines only as told by your health care provider.  If you were prescribed an antibiotic medicine, take it as told by your health care provider. Do not stop taking the antibiotic even if you start to feel better.  Do not drive for 24 hours if you were given a sedative during your procedure.  Ask your health care provider if the medicine prescribed to you: ? Requires you to avoid driving or using heavy machinery. ? Can cause constipation. You may need to take these actions to prevent or treat constipation:  Take over-the-counter or prescription medicines.  Eat foods that are high in fiber, such as beans, whole grains, and fresh fruits and vegetables.  Limit foods that are high in fat and processed sugars, such as fried or sweet foods. Activity  Return to your normal activities as told by your health care provider. Ask your health care provider what activities are safe for you.  Do not lift anything that is heavier than 10 lb (4.5 kg), or the limit that you are told, until your health care provider says that it is safe.  Avoid intense physical activity for as long as told by your health care provider.  Rest as told by your health care provider.  Avoid sitting for a long time without moving. Get up to take short walks every  1-2 hours. This is important to improve blood flow and breathing. Ask for help if you feel weak or unsteady. General instructions   Do not drink alcohol for as long as told by your health care provider. This is especially important if you are taking prescription pain medicines.  Do not take baths, swim, or use a hot tub until your health care provider approves. Ask your health care provider if you may take showers. You may only be allowed to take sponge baths.  If you have a catheter, follow instructions from your health care provider about caring for your catheter and your drainage bag.  Drink enough fluid to keep your urine pale yellow.  Wear compression stockings as told by your health care provider. These stockings help to prevent blood clots and reduce swelling in your legs.  Keep all follow-up visits as told by your health care provider. This is important. ? You will need to be followed closely with regular checks of your bladder and urethra (cystoscopies) to make sure that the cancer does not come back. Contact a health care provider if:  You have pain that gets worse or does not improve with medicine.  You have blood in your urine for more than 2 weeks.  You have cloudy or bad-smelling urine.  You become constipated. Signs of constipation may include having: ? Fewer than three bowel movements in a week. ? Difficulty having a bowel movement. ? Stools that are dry, hard, or larger than normal.  You have  a fever. Get help right away if:  You have: ? Severe pain. ? Bright red blood in your urine. ? Blood clots in your urine. ? A lot of blood in your urine.  Your catheter has been removed and you are not able to urinate.  You have a catheter in place and the catheter is not draining urine. Summary  After your procedure, it is common to have a small amount of blood in your urine, soreness or mild pain from your catheter, and pain in your lower abdomen.  Take  over-the-counter and prescription medicines only as told by your health care provider.  Rest as told by your health care provider. Follow your health care provider's instructions about returning to normal activities. Ask what activities are safe for you.  If you have a catheter, follow instructions from your health care provider about caring for your catheter and your drainage bag.  Get help right away if you cannot urinate, you have severe pain, or you have bright red blood or blood clots in your urine. This information is not intended to replace advice given to you by your health care provider. Make sure you discuss any questions you have with your health care provider.  You may remove the catheter in the morning.   This done by cutting off the yellow nipple on the side arm.  Once the fluid stops draining, the catheter should slide out easily.  If you have difficulty or don't feel comfortable doing this, please call the office to have it removed.   You may resume the plavix in 72 hours if your urine is clear.   Document Revised: 11/07/2017 Document Reviewed: 11/07/2017 Elsevier Patient Education  [place].

## 2020-02-25 NOTE — Anesthesia Procedure Notes (Signed)
Procedure Name: LMA Insertion Date/Time: 02/25/2020 9:46 AM Performed by: Pilar Grammes, CRNA Pre-anesthesia Checklist: Patient identified, Emergency Drugs available, Suction available, Patient being monitored and Timeout performed Patient Re-evaluated:Patient Re-evaluated prior to induction Oxygen Delivery Method: Circle system utilized Preoxygenation: Pre-oxygenation with 100% oxygen Induction Type: IV induction Ventilation: Mask ventilation without difficulty LMA: LMA inserted LMA Size: 4.5 Tube secured with: Tape

## 2020-02-25 NOTE — Interval H&P Note (Signed)
History and Physical Interval Note:  02/25/2020 9:20 AM  Barry Alvarado  has presented today for surgery, with the diagnosis of bladder tumor.  The various methods of treatment have been discussed with the patient and family. After consideration of risks, benefits and other options for treatment, the patient has consented to  Procedure(s): TRANSURETHRAL RESECTION OF BLADDER TUMOR WITH GEMCITABINE (N/A) as a surgical intervention.  The patient's history has been reviewed, patient examined, no change in status, stable for surgery.  I have reviewed the patient's chart and labs.  Questions were answered to the patient's satisfaction.     Irine Seal

## 2020-02-25 NOTE — Transfer of Care (Signed)
Immediate Anesthesia Transfer of Care Note  Patient: Barry Alvarado  Procedure(s) Performed: TRANSURETHRAL RESECTION OF BLADDER TUMOR WITH GEMCITABINE (N/A )  Patient Location: PACU  Anesthesia Type:General  Level of Consciousness: awake, sedated and patient cooperative  Airway & Oxygen Therapy: Patient Spontanous Breathing and Patient connected to face mask oxygen  Post-op Assessment: Report given to RN and Post -op Vital signs reviewed and stable  Post vital signs: stable  Last Vitals:  Vitals Value Taken Time  BP 161/94 02/25/20 1022  Temp    Pulse    Resp 18 02/25/20 1023  SpO2    Vitals shown include unvalidated device data.  Last Pain:  Vitals:   02/25/20 0755  TempSrc:   PainSc: 0-No pain         Complications: No complications documented.

## 2020-02-26 ENCOUNTER — Encounter (HOSPITAL_COMMUNITY): Payer: Self-pay | Admitting: Urology

## 2020-02-26 LAB — SURGICAL PATHOLOGY

## 2020-03-04 ENCOUNTER — Ambulatory Visit (INDEPENDENT_AMBULATORY_CARE_PROVIDER_SITE_OTHER): Payer: Medicare HMO | Admitting: Urology

## 2020-03-04 ENCOUNTER — Encounter: Payer: Self-pay | Admitting: Urology

## 2020-03-04 ENCOUNTER — Other Ambulatory Visit: Payer: Self-pay

## 2020-03-04 DIAGNOSIS — C61 Malignant neoplasm of prostate: Secondary | ICD-10-CM

## 2020-03-04 DIAGNOSIS — R351 Nocturia: Secondary | ICD-10-CM

## 2020-03-04 DIAGNOSIS — C670 Malignant neoplasm of trigone of bladder: Secondary | ICD-10-CM

## 2020-03-04 DIAGNOSIS — N401 Enlarged prostate with lower urinary tract symptoms: Secondary | ICD-10-CM

## 2020-03-04 DIAGNOSIS — N138 Other obstructive and reflux uropathy: Secondary | ICD-10-CM

## 2020-03-04 LAB — URINALYSIS, ROUTINE W REFLEX MICROSCOPIC
Bilirubin, UA: NEGATIVE
Glucose, UA: NEGATIVE
Leukocytes,UA: NEGATIVE
Nitrite, UA: NEGATIVE
Specific Gravity, UA: >1.03 — ABNORMAL HIGH (ref 1.005–1.030)
Urobilinogen, Ur: 1 mg/dL (ref 0.2–1.0)
pH, UA: 5.5 (ref 5.0–7.5)

## 2020-03-04 LAB — MICROSCOPIC EXAMINATION
Bacteria, UA: NONE SEEN
Epithelial Cells (non renal): NONE SEEN /hpf (ref 0–10)
Renal Epithel, UA: NONE SEEN /hpf
WBC, UA: NONE SEEN /hpf (ref 0–5)

## 2020-03-04 NOTE — Progress Notes (Signed)
Urological Symptom Review  Patient is experiencing the following symptoms: Frequent urination Hard to postpone urination Get up at night to urinate Erection problems   Review of Systems  Gastrointestinal (upper)  : Indigestion/heartburn  Gastrointestinal (lower) : Negative for lower GI symptoms  Constitutional : Night Sweats Weight loss Fatigue  Skin: Negative for skin symptoms  Eyes: Negative for eye symptoms  Ear/Nose/Throat : Sinus problems  Hematologic/Lymphatic: Negative for Hematologic/Lymphatic symptoms  Cardiovascular : Negative for cardiovascular symptoms  Respiratory : Shortness of breath  Endocrine: Negative for endocrine symptoms  Musculoskeletal: Back pain Joint pain  Neurological: Negative for neurological symptoms  Psychologic: Anxiety

## 2020-03-04 NOTE — Progress Notes (Signed)
Subjective: 1. 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 Prostate cancer ([place])   3. BPH with urinary obstruction   4. Nocturia      03/04/20: Barry Alvarado return today follow a TURBT.  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  He is doing well without hematuria.  His UA is unremarkable.  He has an IPSS of 14.  He has a good stream but has some nocturia.  He can have some hesitancy.   He got his initial firmagon on 02/18/20.   He had raised knots on his abdomen.  He is to get marked for EXRT on 04/19/20 at [facility].    02/05/20: Barry Alvarado returns today in f/u to discuss his recent prostate biopsy on 01/22/20.   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  4/12 cores had Gleason 8 and a total of 9/12 cores are involved with prostate cancer.   He had a negative bone scan on 02/02/20 and a CT hematuria study on 01/19/20 showed no mets.   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  His prostate volume is 57ml. He has 3-10 RBC's on UA today and needs cystoscopy.    GU Hx: Barry Alvarado is a 74 yo male who is sent in consultation by Dr. Jonny Ruiz for an elevated PSA with a further rise on a repeat.  I am unable to get the levels as they were not in the referral documents and we are unable to reach anyone in the referring office.  The patient is unaware of the levels.   He has had some issues with voiding and is on tamsulosin which has been helping.  He has nocturia x 1-2.  He had frequency but that has improved on tamsulosin.  He has urgency and has had UUI but that has improved.  He has a variable stream.  He feels like he doesn't empty completely.  He has no dysuria or hematuria.  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   He has had no UTI's but he may have had stones in the past but no GU surgery.  He has some issues with ED and has been given sildenafil.  He is a former smoker but none in 12 years.     ROS:  ROS  Allergies  Allergen Reactions  . Penicillins Hives    Reaction: Childhood  . Atorvastatin Other (See Comments)    Stomach burn  . Omeprazole Other (See Comments)    unknown  . Shellfish Allergy Swelling    Numb face  . Tramadol Other (See Comments)    unknown  . Zoloft [Sertraline] Other (See Comments)    unknown    Past Medical History:  Diagnosis Date  . Anxiety   . CAD (coronary artery disease)    pt reports stent placed ~2011  . Cancer [facility])    Prostate, bladder  . COPD (chronic obstructive pulmonary disease) ([place])   . CVA (cerebral vascular accident) ([place]) 2017   He has had 5 strokes.   . Erectile dysfunction due to arterial insufficiency   . GERD (gastroesophageal reflux disease)   . Hypertension     Past Surgical History:  Procedure Laterality Date  . CARDIAC CATHETERIZATION  2011   with stent  . CHOLECYSTECTOMY  4-5 yrs ago  . CYSTOSCOPY  01/29/2020   bladder  . TRANSURETHRAL RESECTION OF BLADDER TUMOR  WITH MITOMYCIN-C N/A 02/25/2020   Procedure: TRANSURETHRAL RESECTION OF BLADDER TUMOR WITH GEMCITABINE;  Surgeon: Irine Seal, MD;  Location: [facility];  Service: Urology;  Laterality: N/A;    Social History   Socioeconomic History  . Marital status: Married    Spouse name: Not on file  . Number of children: Not on file  . Years of education: Not on file  . Highest education level: Not on file  Occupational History  . Not on file  Tobacco Use  . Smoking status: Former Research scientist (life sciences)  . Smokeless tobacco: Never Used  Vaping Use  . Vaping Use: Never used  Substance and Sexual Activity  . Alcohol use: Yes    Comment: occasional  . Drug use: Not Currently  . Sexual activity: Yes  Other Topics Concern  . Not on file  Social History Narrative  . Not on file   Social Determinants of Health   Financial Resource Strain:   . Difficulty of Paying Living Expenses: Not on file  Food Insecurity:   . Worried About Charity fundraiser in the Last  Year: Not on file  . Ran Out of Food in the Last Year: Not on file  Transportation Needs:   . Lack of Transportation (Medical): Not on file  . Lack of Transportation (Non-Medical): Not on file  Physical Activity:   . Days of Exercise per Week: Not on file  . Minutes of Exercise per Session: Not on file  Stress:   . Feeling of Stress : Not on file  Social Connections:   . Frequency of Communication with Friends and Family: Not on file  . Frequency of Social Gatherings with Friends and Family: Not on file  . Attends Religious Services: Not on file  . Active Member of Clubs or Organizations: Not on file  . Attends Archivist Meetings: Not on file  . Marital Status: Not on file  Intimate Partner Violence:   . Fear of Current or Ex-Partner: Not on file  . Emotionally Abused: Not on file  . Physically Abused: Not on file  . Sexually Abused: Not on file    Family History  Problem Relation Age of Onset  . Alzheimer's disease Mother   . Heart attack Father     Anti-infectives: Anti-infectives (From admission, onward)   None      Current Outpatient Medications  Medication Sig Dispense Refill  . amLODipine (NORVASC) 10 MG tablet Take by mouth.    . budesonide-formoterol (SYMBICORT) 160-4.5 MCG/ACT inhaler Inhale 2 puffs into the lungs 2 (two) times daily.    . clopidogrel (PLAVIX) 75 MG tablet Take 75 mg by mouth daily.    Marland Kitchen Dextran 70-Hypromellose 0.1-0.3 % SOLN Place 1 drop into both eyes 2 (two) times daily.     Marland Kitchen Dextran 70-Hypromellose 0.1-0.3 % SOLN Apply to eye.    Mariane Baumgarten Sodium (DSS) 100 MG CAPS Take by mouth.    . gabapentin (NEURONTIN) 300 MG capsule Take 300 mg by mouth 3 (three) times daily.    . hydrochlorothiazide (HYDRODIURIL) 50 MG tablet Take 50 mg by mouth daily.    Marland Kitchen HYDROcodone-acetaminophen (NORCO/VICODIN) 5-325 MG tablet Take 1 tablet by mouth 2 (two) times daily as needed for pain.    Marland Kitchen lidocaine (LIDODERM) 5 % Place 1 patch onto the skin daily  as needed (Pain). Remove & Discard patch within 12 hours or as directed by MD     . LORazepam (ATIVAN) 1 MG tablet Take 1 mg  by mouth at bedtime.     . Multiple Vitamin (MULTIVITAMIN) tablet Take 1 tablet by mouth daily.    Marland Kitchen omega-3 acid ethyl esters (LOVAZA) 1 g capsule Take by mouth daily.    . pantoprazole (PROTONIX) 20 MG tablet Take 20 mg by mouth daily.     . sildenafil (VIAGRA) 100 MG tablet Take 100 mg by mouth daily as needed for erectile dysfunction.    . tamsulosin (FLOMAX) 0.4 MG CAPS capsule Take 0.4 mg by mouth daily.     Marland Kitchen triamcinolone cream (KENALOG) 0.5 % Apply 1 application topically daily as needed (cut/rash).     Marland Kitchen amlodipine-atorvastatin (CADUET) 10-10 MG tablet Take 1 tablet by mouth daily. (Patient not taking: Reported on 03/04/2020)     No current facility-administered medications for this visit.     Objective: Vital signs in last 24 hours: BP (!) 160/87   Pulse 84   Temp 97.7 F (36.5 C)   Ht 6' (1.829 m)   Wt 209 lb (94.8 kg)   BMI 28.35 kg/m   Intake/Output from previous day: No intake/output data recorded. Intake/Output this shift: @IOTHISSHIFT @   Physical Exam  Lab Results:  Results for orders placed or performed in visit on 03/04/20 (from the past 24 hour(s))  Urinalysis, Routine w reflex microscopic     Status: Abnormal   Collection Time: 03/04/20  2:30 PM  Result Value Ref Range   Specific Gravity, UA >1.030 (H) 1.005 - 1.030   pH, UA 5.5 5.0 - 7.5   Color, UA Yellow Yellow   Appearance Ur Clear Clear   Leukocytes,UA Negative Negative   Protein,UA 3+ (A) Negative/Trace   Glucose, UA Negative Negative   Ketones, UA Trace (A) Negative   RBC, UA Trace (A) Negative   Bilirubin, UA Negative Negative   Urobilinogen, Ur 1.0 0.2 - 1.0 mg/dL   Nitrite, UA Negative Negative   Microscopic Examination See below:    Narrative   Performed at:  [facility] [address] [place], [place]  [id number] Lab Director: Mina Marble  MT, Phone:  [phone]  Microscopic Examination     Status: None   Collection Time: 03/04/20  2:30 PM   Urine  Result Value Ref Range   WBC, UA None seen 0 - 5 /hpf   RBC 0-2 0 - 2 /hpf   Epithelial Cells (non renal) None seen 0 - 10 /hpf   Renal Epithel, UA None seen None seen /hpf   Mucus, UA Present Not Estab.   Bacteria, UA None seen None seen/Few   Narrative   Performed at:  [facility] [address] [place], [place]  [id number] Lab Director: Sayner, Phone:  [phone]    BMET No results for input(s): NA, K, CL, CO2, GLUCOSE, BUN, CREATININE, CALCIUM in the last 72 hours. PT/INR No results for input(s): LABPROT, INR in the last 72 hours. ABG No results for input(s): PHART, HCO3 in the last 72 hours.  Invalid input(s): PCO2, PO2  Studies/Results: UA 0-2 RBC's  Assessment/Plan: Prostate cancer.  He will return later this month for 80mg  firmagon.  He has T1c N0 M0 Gleason 8 prostate cancer in multiple cores and a single core with intraductal carcinoma.   I discuss options for therapy and feel that with his disease and comorbidities that he will be best served with EXRT with 2 years of ADT.    I reviewed the risks and side effects of that option and  will have him return soon to initiate ADT with firmagon.   I believe he will need to stay on firmagon because of his cardiovascular history.  I will send him to [facility] for consideration of radiation therapy.    Bladder cancer.  He had LG NMIBC and will need cystoscopy in 3 months.  He can resume the plavix.   BPH with BOO.  He is doing better with tamsulosin and will continue that med.     No orders of the defined types were placed in this encounter.    Orders Placed This Encounter  Procedures  . Microscopic Examination  . PSA    Standing Status:   Future    Standing Expiration Date:   07/02/2020  . Testosterone    Standing Status:   Future    Standing Expiration Date:   07/02/2020  .  Urinalysis, Routine w reflex microscopic     Return in about 3 months (around 06/04/2020) for for cystoscopy.    CC: Dr. Jonny Ruiz.     Irine Seal 03/04/2020 [phone]

## 2020-03-22 ENCOUNTER — Ambulatory Visit (INDEPENDENT_AMBULATORY_CARE_PROVIDER_SITE_OTHER): Payer: Medicare HMO

## 2020-03-22 ENCOUNTER — Other Ambulatory Visit: Payer: Self-pay

## 2020-03-22 DIAGNOSIS — C61 Malignant neoplasm of prostate: Secondary | ICD-10-CM

## 2020-03-22 MED ORDER — DEGARELIX ACETATE 80 MG ~~LOC~~ SOLR
80.0000 mg | Freq: Once | SUBCUTANEOUS | Status: AC
  Administered 2020-03-22: 80 mg via SUBCUTANEOUS

## 2020-03-22 NOTE — Progress Notes (Signed)
Firmagon Sub Q Injection  Due to Prostate Cancer patient is present today for a Firmagon Injection.   Medication: Mills Koller (Degarelix)  Dose: 80mg  Location: left upper abdomen Lot: [id number] Exp: 04/23/22  Patient tolerated well, no complications were noted  Performed by: Rhylee Pucillo, LPN  Follow up: Keep next scheduled NV

## 2020-04-19 ENCOUNTER — Ambulatory Visit: Payer: Medicare HMO

## 2020-04-20 ENCOUNTER — Ambulatory Visit: Payer: Medicare HMO

## 2020-04-28 ENCOUNTER — Other Ambulatory Visit: Payer: Self-pay

## 2020-04-28 ENCOUNTER — Ambulatory Visit (INDEPENDENT_AMBULATORY_CARE_PROVIDER_SITE_OTHER): Payer: Medicare Other

## 2020-04-28 DIAGNOSIS — C61 Malignant neoplasm of prostate: Secondary | ICD-10-CM

## 2020-04-28 MED ORDER — DEGARELIX ACETATE 80 MG ~~LOC~~ SOLR
80.0000 mg | Freq: Once | SUBCUTANEOUS | Status: AC
  Administered 2020-04-28: 80 mg via SUBCUTANEOUS

## 2020-04-28 NOTE — Progress Notes (Signed)
SubQ Injection  Patient is present today for a SubQ Injection for treatment of Prostate cancer Drug: firmagon Dose: 80 mg Location: rt abdomen Lot: [id number] Exp: 04/2022  Patient tolerated well, no complications were noted  Performed by: Ramya Vanbergen, LPN  Follow up/ Additional Notes: Keep next scheduled OV

## 2020-05-18 ENCOUNTER — Ambulatory Visit: Payer: Medicare HMO

## 2020-05-26 ENCOUNTER — Other Ambulatory Visit: Payer: Medicare HMO | Admitting: Urology

## 2020-05-26 ENCOUNTER — Ambulatory Visit: Payer: Medicare HMO

## 2020-06-09 ENCOUNTER — Other Ambulatory Visit: Payer: Medicare HMO | Admitting: Urology

## 2020-06-15 NOTE — Progress Notes (Signed)
Subjective: 1. History of bladder cancer   2. Prostate cancer ([place])   3. Nocturia      06/16/20: Barry Alvarado returns today in f/u.  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  He completed radiation therapy on 06/13/20.   He has no hematuria.  He is voiding ok with an IPSS of 10.  He has nocturia x 3 and an occasional sensation of incomplete emptying.  UA is clear today.  He remains on tamsulosin but is up to bid.   03/04/20: Barry Alvarado return today follow a TURBT.  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  He is doing well without hematuria.  His UA is unremarkable.  He has an IPSS of 14.  He has a good stream but has some nocturia.  He can have some hesitancy.   He got his initial firmagon on 02/18/20.   He had raised knots on his abdomen.  He is to get marked for EXRT on 04/19/20 at [facility].    02/05/20: Barry Alvarado returns today in f/u to discuss his recent prostate biopsy on 01/22/20.   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  4/12 cores had Gleason 8 and a total of 9/12 cores are involved with prostate cancer.   He had a negative bone scan on 02/02/20 and a CT hematuria study on 01/19/20 showed no mets.   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  His prostate volume is 52ml. He has 3-10 RBC's on UA today and needs cystoscopy.    GU Hx: Barry Alvarado is a 75 yo male who is sent in consultation by Dr. Jonny Ruiz for an elevated PSA with a further rise on a repeat.  I am unable to get the levels as they were not in the referral documents and we are unable to reach anyone in the referring office.  The patient is unaware of the levels.   He has had some issues with voiding and is on tamsulosin which has been helping.  He has nocturia x 1-2.  He had frequency but that has improved on tamsulosin.  He has urgency and has had UUI but that has improved.  He has a variable stream.  He feels like he doesn't empty  completely.  He has no dysuria or hematuria.  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   He has had no UTI's but he may have had stones in the past but no GU surgery.  He has some issues with ED and has been given sildenafil.  He is a former smoker but none in 12 years.    ROS:  ROS  Allergies  Allergen Reactions   Penicillins Hives    Reaction: Childhood   Atorvastatin Other (See Comments)    Stomach burn   Omeprazole Other (See Comments)    unknown   Shellfish Allergy Swelling    Numb face   Tramadol Other (See Comments)    unknown   Zoloft [Sertraline] Other (See Comments)    unknown    Past Medical History:  Diagnosis Date   Anxiety    CAD (coronary artery disease)    pt reports stent placed ~2011   Cancer [facility])    Prostate, bladder   COPD (chronic obstructive pulmonary disease) (HCC)    CVA (cerebral vascular accident) ([place]) 2017   He has had 5  strokes.    Erectile dysfunction due to arterial insufficiency    GERD (gastroesophageal reflux disease)    Hypertension     Past Surgical History:  Procedure Laterality Date   CARDIAC CATHETERIZATION  2011   with stent   CHOLECYSTECTOMY  4-5 yrs ago   CYSTOSCOPY  01/29/2020   bladder   TRANSURETHRAL RESECTION OF BLADDER TUMOR WITH MITOMYCIN-C N/A 02/25/2020   Procedure: TRANSURETHRAL RESECTION OF BLADDER TUMOR WITH GEMCITABINE;  Surgeon: Irine Seal, MD;  Location: [facility];  Service: Urology;  Laterality: N/A;    Social History   Socioeconomic History   Marital status: Married    Spouse name: Not on file   Number of children: Not on file   Years of education: Not on file   Highest education level: Not on file  Occupational History   Not on file  Tobacco Use   Smoking status: Former Smoker   Smokeless tobacco: Never Used  Scientific laboratory technician Use: Never used  Substance and Sexual Activity   Alcohol use: Yes    Comment: occasional   Drug use: Not Currently   Sexual activity:  Yes  Other Topics Concern   Not on file  Social History Narrative   Not on file   Social Determinants of Health   Financial Resource Strain: Not on file  Food Insecurity: Not on file  Transportation Needs: Not on file  Physical Activity: Not on file  Stress: Not on file  Social Connections: Not on file  Intimate Partner Violence: Not on file    Family History  Problem Relation Age of Onset   Alzheimer's disease Mother    Heart attack Father     Anti-infectives: Anti-infectives (From admission, onward)   None      Current Outpatient Medications  Medication Sig Dispense Refill   amLODipine (NORVASC) 10 MG tablet Take by mouth.     amlodipine-atorvastatin (CADUET) 10-10 MG tablet Take 1 tablet by mouth daily.     clopidogrel (PLAVIX) 75 MG tablet Take 75 mg by mouth daily.     Dextran 70-Hypromellose 0.1-0.3 % SOLN Place 1 drop into both eyes 2 (two) times daily.      Dextran 70-Hypromellose 0.1-0.3 % SOLN Apply to eye.     Docusate Sodium (DSS) 100 MG CAPS Take by mouth.     gabapentin (NEURONTIN) 300 MG capsule Take 300 mg by mouth 3 (three) times daily.     hydrochlorothiazide (HYDRODIURIL) 50 MG tablet Take 50 mg by mouth daily.     HYDROcodone-acetaminophen (NORCO/VICODIN) 5-325 MG tablet Take 1 tablet by mouth 2 (two) times daily as needed for pain.     lidocaine (LIDODERM) 5 % Place 1 patch onto the skin daily as needed (Pain). Remove & Discard patch within 12 hours or as directed by MD     LORazepam (ATIVAN) 1 MG tablet Take 1 mg by mouth at bedtime.      methocarbamol (ROBAXIN) 500 MG tablet Take 500 mg by mouth 2 (two) times daily as needed.     Multiple Vitamin (MULTIVITAMIN) tablet Take 1 tablet by mouth daily.     omega-3 acid ethyl esters (LOVAZA) 1 g capsule Take by mouth daily.     pantoprazole (PROTONIX) 20 MG tablet Take 20 mg by mouth daily.      sildenafil (VIAGRA) 100 MG tablet Take 100 mg by mouth daily as needed for erectile  dysfunction.     triamcinolone cream (KENALOG) 0.5 % Apply 1 application topically  daily as needed (cut/rash).      budesonide-formoterol (SYMBICORT) 160-4.5 MCG/ACT inhaler Inhale 2 puffs into the lungs 2 (two) times daily. (Patient not taking: Reported on 06/16/2020)     tamsulosin (FLOMAX) 0.4 MG CAPS capsule Take 2 capsules (0.8 mg total) by mouth daily. 180 capsule 3   No current facility-administered medications for this visit.     Objective: Vital signs in last 24 hours: BP (!) 163/84    Pulse 96    Temp (!) 97.4 F (36.3 C)    Ht 6' (1.829 m)    Wt 218 lb (98.9 kg)    BMI 29.57 kg/m   Intake/Output from previous day: No intake/output data recorded. Intake/Output this shift: @IOTHISSHIFT @   Physical Exam  Lab Results:  Results for orders placed or performed in visit on 06/16/20 (from the past 24 hour(s))  Urinalysis, Routine w reflex microscopic     Status: Abnormal   Collection Time: 06/16/20  3:05 PM  Result Value Ref Range   Specific Gravity, UA >1.030 (H) 1.005 - 1.030   pH, UA 5.0 5.0 - 7.5   Color, UA Yellow Yellow   Appearance Ur Clear Clear   Leukocytes,UA Negative Negative   Protein,UA 3+ (A) Negative/Trace   Glucose, UA Negative Negative   Ketones, UA Negative Negative   RBC, UA Negative Negative   Bilirubin, UA Negative Negative   Urobilinogen, Ur 1.0 0.2 - 1.0 mg/dL   Nitrite, UA Negative Negative   Microscopic Examination See below:    Narrative   Performed at:  [facility] [address] [place], [place]  [id number] Lab Director: Mina Marble [place], Phone:  [phone]  Microscopic Examination     Status: None   Collection Time: 06/16/20  3:05 PM   Urine  Result Value Ref Range   WBC, UA None seen 0 - 5 /hpf   RBC None seen 0 - 2 /hpf   Epithelial Cells (non renal) None seen 0 - 10 /hpf   Renal Epithel, UA None seen None seen /hpf   Bacteria, UA None seen None seen/Few   Narrative   Performed at:  [facility] [address] [place], [place]  [id number] Lab Director: [place], Phone:  [phone]    BMET No results for input(s): NA, K, CL, CO2, GLUCOSE, BUN, CREATININE, CALCIUM in the last 72 hours. PT/INR No results for input(s): LABPROT, INR in the last 72 hours. ABG No results for input(s): PHART, HCO3 in the last 72 hours.  Invalid input(s): PCO2, PO2  Studies/Results: UA 0-2 RBC's  Assessment/Plan: Prostate cancer.  He has just finished XRT and is doing well with minimal complaints.  He will need a PSA in 3 months.   He will continue the ADT with firmagon. .    Bladder cancer.  He had LG NMIBC and is due for cystoscopy but I will delay it for 6 weeks because he just completed the radiation.   BPH with BOO.  He will continue  tamsulosin 2 daily which I refilled.     Meds ordered this encounter  Medications   tamsulosin (FLOMAX) 0.4 MG CAPS capsule    Sig: Take 2 capsules (0.8 mg total) by mouth daily.    Dispense:  180 capsule    Refill:  3     Orders Placed This Encounter  Procedures   Microscopic Examination   Urinalysis, Routine w reflex microscopic     Return in about 6 weeks (  around 07/28/2020) for cystoscopy on return.    CC: Dr. Jonny Ruiz.     Irine Seal 06/17/2020 [phone]

## 2020-06-16 ENCOUNTER — Encounter: Payer: Self-pay | Admitting: Urology

## 2020-06-16 ENCOUNTER — Ambulatory Visit (INDEPENDENT_AMBULATORY_CARE_PROVIDER_SITE_OTHER): Payer: Medicare Other | Admitting: Urology

## 2020-06-16 ENCOUNTER — Other Ambulatory Visit: Payer: Self-pay

## 2020-06-16 VITALS — BP 163/84 | HR 96 | Temp 97.4°F | Ht 72.0 in | Wt 218.0 lb

## 2020-06-16 DIAGNOSIS — Z8551 Personal history of malignant neoplasm of bladder: Secondary | ICD-10-CM | POA: Diagnosis not present

## 2020-06-16 DIAGNOSIS — R351 Nocturia: Secondary | ICD-10-CM

## 2020-06-16 DIAGNOSIS — C61 Malignant neoplasm of prostate: Secondary | ICD-10-CM | POA: Diagnosis not present

## 2020-06-16 LAB — URINALYSIS, ROUTINE W REFLEX MICROSCOPIC
Bilirubin, UA: NEGATIVE
Glucose, UA: NEGATIVE
Ketones, UA: NEGATIVE
Leukocytes,UA: NEGATIVE
Nitrite, UA: NEGATIVE
RBC, UA: NEGATIVE
Specific Gravity, UA: >1.03 — ABNORMAL HIGH (ref 1.005–1.030)
Urobilinogen, Ur: 1 mg/dL (ref 0.2–1.0)
pH, UA: 5 (ref 5.0–7.5)

## 2020-06-16 LAB — MICROSCOPIC EXAMINATION
Bacteria, UA: NONE SEEN
Epithelial Cells (non renal): NONE SEEN /hpf (ref 0–10)
RBC, Urine: NONE SEEN /hpf (ref 0–2)
Renal Epithel, UA: NONE SEEN /hpf
WBC, UA: NONE SEEN /hpf (ref 0–5)

## 2020-06-16 MED ORDER — TAMSULOSIN HCL 0.4 MG PO CAPS: 0.8000 mg | ORAL_CAPSULE | Freq: Every day | ORAL | 3 refills | Status: DC

## 2020-06-16 NOTE — Progress Notes (Signed)
Urological Symptom Review  Patient is experiencing the following symptoms: Frequent urination Hard to postpone urination Get up at night to urinate Have to strain to urinate   Review of Systems  Gastrointestinal (upper)  : Nausea Vomiting Indigestion/heartburn  Gastrointestinal (lower) : Diarrhea  Constitutional : Night Sweats Fatigue  Skin: Negative for skin symptoms  Eyes: Negative for eye symptoms  Ear/Nose/Throat : Sinus problems  Hematologic/Lymphatic: Easy bruising  Cardiovascular : Negative for cardiovascular symptoms  Respiratory : Shortness of breath  Endocrine: Negative for endocrine symptoms  Musculoskeletal: Back pain  Neurological: Negative for neurological symptoms  Psychologic: Anxiety

## 2020-06-20 ENCOUNTER — Other Ambulatory Visit: Payer: Self-pay

## 2020-06-20 ENCOUNTER — Ambulatory Visit (INDEPENDENT_AMBULATORY_CARE_PROVIDER_SITE_OTHER): Payer: Medicare Other

## 2020-06-20 DIAGNOSIS — C61 Malignant neoplasm of prostate: Secondary | ICD-10-CM | POA: Diagnosis not present

## 2020-06-20 MED ORDER — DEGARELIX ACETATE 80 MG ~~LOC~~ SOLR
80.0000 mg | Freq: Once | SUBCUTANEOUS | Status: AC
  Administered 2020-06-20: 80 mg via SUBCUTANEOUS

## 2020-06-20 NOTE — Progress Notes (Signed)
Firmagon Sub Q Injection  Due to Prostate Cancer patient is present today for a Firmagon Injection.   Medication: Mills Koller (Degarelix)  Dose: 80mg  Location: left upper abdomen Lot: [id number] Exp: 03/24  Patient tolerated well, no complications were noted  Performed by: Antionette Char, Debra,LPN  Follow up: 1 month

## 2020-07-18 ENCOUNTER — Other Ambulatory Visit: Payer: Self-pay

## 2020-07-18 ENCOUNTER — Ambulatory Visit (INDEPENDENT_AMBULATORY_CARE_PROVIDER_SITE_OTHER): Payer: Medicare Other

## 2020-07-18 DIAGNOSIS — C61 Malignant neoplasm of prostate: Secondary | ICD-10-CM | POA: Diagnosis not present

## 2020-07-18 MED ORDER — DEGARELIX ACETATE 80 MG ~~LOC~~ SOLR
80.0000 mg | Freq: Once | SUBCUTANEOUS | Status: AC
  Administered 2020-07-18: 80 mg via SUBCUTANEOUS

## 2020-07-18 NOTE — Progress Notes (Signed)
Firmagon Sub Q Injection  Due to Prostate Cancer patient is present today for a Firmagon Injection.   Medication: Mills Koller (Degarelix)  Dose: 80mg  Location: right upper abdomen Lot: [id number] Exp: 05 2024  Patient tolerated well, no complications were noted  Performed by: Kyen Taite, lpn  Follow up: Keep next scheduled OV

## 2020-08-18 ENCOUNTER — Other Ambulatory Visit: Payer: Self-pay

## 2020-08-18 ENCOUNTER — Encounter: Payer: Self-pay | Admitting: Urology

## 2020-08-18 ENCOUNTER — Ambulatory Visit (INDEPENDENT_AMBULATORY_CARE_PROVIDER_SITE_OTHER): Payer: Medicare Other | Admitting: Urology

## 2020-08-18 VITALS — BP 169/83 | HR 92 | Temp 97.8°F | Ht 72.0 in | Wt 217.0 lb

## 2020-08-18 DIAGNOSIS — Z8551 Personal history of malignant neoplasm of bladder: Secondary | ICD-10-CM

## 2020-08-18 DIAGNOSIS — R351 Nocturia: Secondary | ICD-10-CM

## 2020-08-18 DIAGNOSIS — R3915 Urgency of urination: Secondary | ICD-10-CM

## 2020-08-18 DIAGNOSIS — N401 Enlarged prostate with lower urinary tract symptoms: Secondary | ICD-10-CM

## 2020-08-18 DIAGNOSIS — N138 Other obstructive and reflux uropathy: Secondary | ICD-10-CM

## 2020-08-18 DIAGNOSIS — C61 Malignant neoplasm of prostate: Secondary | ICD-10-CM | POA: Diagnosis not present

## 2020-08-18 LAB — URINALYSIS, ROUTINE W REFLEX MICROSCOPIC
Bilirubin, UA: NEGATIVE
Glucose, UA: NEGATIVE
Leukocytes,UA: NEGATIVE
Nitrite, UA: NEGATIVE
RBC, UA: NEGATIVE
Specific Gravity, UA: 1.025 (ref 1.005–1.030)
Urobilinogen, Ur: 0.2 mg/dL (ref 0.2–1.0)
pH, UA: 5.5 (ref 5.0–7.5)

## 2020-08-18 LAB — MICROSCOPIC EXAMINATION
Bacteria, UA: NONE SEEN
Epithelial Cells (non renal): NONE SEEN /hpf (ref 0–10)
RBC, Urine: NONE SEEN /hpf (ref 0–2)
Renal Epithel, UA: NONE SEEN /hpf
WBC, UA: NONE SEEN /hpf (ref 0–5)

## 2020-08-18 MED ORDER — CIPROFLOXACIN HCL 500 MG PO TABS
500.0000 mg | ORAL_TABLET | Freq: Once | ORAL | Status: AC
  Administered 2020-08-18: 500 mg via ORAL

## 2020-08-18 MED ORDER — DEGARELIX ACETATE 80 MG ~~LOC~~ SOLR
80.0000 mg | Freq: Once | SUBCUTANEOUS | Status: AC
  Administered 2020-08-18: 80 mg via SUBCUTANEOUS

## 2020-08-18 NOTE — Progress Notes (Signed)
Urological Symptom Review  Patient is experiencing the following symptoms: Frequent urination Get up at night to urinate Stream starts and stops Trouble starting stream Injury to kidneys/bladder   Review of Systems  Gastrointestinal (upper)  : Negative for upper GI symptoms  Gastrointestinal (lower) : Diarrhea  Constitutional : Fatigue  Skin: Negative for skin symptoms  Eyes: Blurred vision  Ear/Nose/Throat : Sinus problems  Hematologic/Lymphatic: Easy bruising  Cardiovascular : Leg swelling  Respiratory : Shortness of breath  Endocrine: Negative for endocrine symptoms  Musculoskeletal: Back pain  Neurological: Negative for neurological symptoms  Psychologic: Anxiety

## 2020-08-18 NOTE — Progress Notes (Signed)
Subjective: 1. Prostate cancer ([place])   2. History of bladder cancer   3. BPH with urinary obstruction   4. Nocturia   5. Urgency of urination      06/16/20: Mr. Kras returns today in f/u.  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  He completed radiation therapy on 06/13/20.   He has no hematuria.  He is voiding ok with an IPSS of 19.  He has nocturia x 2 and an occasional sensation of incomplete emptying.  He has some intermittency.  He has some urgency with occasional UUI.   UA is clear today.  He remains on tamsulosin but is up to bid.   03/04/20: Mr. Eichinger return today follow a TURBT.  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  He is doing well without hematuria.  His UA is unremarkable.  He has an IPSS of 14.  He has a good stream but has some nocturia.  He can have some hesitancy.   He got his initial firmagon on 02/18/20.   He had raised knots on his abdomen.  He is to get marked for EXRT on 04/19/20 at [facility].    02/05/20: Mr. Ozaki returns today in f/u to discuss his recent prostate biopsy on 01/22/20.   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  4/12 cores had Gleason 8 and a total of 9/12 cores are involved with prostate cancer.   He had a negative bone scan on 02/02/20 and a CT hematuria study on 01/19/20 showed no mets.   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  His prostate volume is 74ml. He has 3-10 RBC's on UA today and needs cystoscopy.    GU Hx: Mr. Ashland is a 75 yo male who is sent in consultation by Dr. Jonny Ruiz for an elevated PSA with a further rise on a repeat.  I am unable to get the levels as they were not in the referral documents and we are unable to reach anyone in the referring office.  The patient is unaware of the levels.   He has had some issues with voiding and is on tamsulosin which has been helping.  He has nocturia x 1-2.  He had frequency but that has  improved on tamsulosin.  He has urgency and has had UUI but that has improved.  He has a variable stream.  He feels like he doesn't empty completely.  He has no dysuria or hematuria.  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   He has had no UTI's but he may have had stones in the past but no GU surgery.  He has some issues with ED and has been given sildenafil.  He is a former smoker but none in 12 years.    ROS:  ROS  Allergies  Allergen Reactions  . Penicillins Hives    Reaction: Childhood  . Atorvastatin Other (See Comments)    Stomach burn  . Omeprazole Other (See Comments)    unknown  . Shellfish Allergy Swelling    Numb face  . Tramadol Other (See Comments)    unknown  . Zoloft [Sertraline] Other (See Comments)    unknown    Past Medical History:  Diagnosis Date  . Anxiety   . CAD (coronary artery disease)    pt reports stent placed ~2011  . Cancer ([place])  Prostate, bladder  . COPD (chronic obstructive pulmonary disease) ([place])   . CVA (cerebral vascular accident) ([place]) 2017   He has had 5 strokes.   . Erectile dysfunction due to arterial insufficiency   . GERD (gastroesophageal reflux disease)   . Hypertension     Past Surgical History:  Procedure Laterality Date  . CARDIAC CATHETERIZATION  2011   with stent  . CHOLECYSTECTOMY  4-5 yrs ago  . CYSTOSCOPY  01/29/2020   bladder  . TRANSURETHRAL RESECTION OF BLADDER TUMOR WITH MITOMYCIN-C N/A 02/25/2020   Procedure: TRANSURETHRAL RESECTION OF BLADDER TUMOR WITH GEMCITABINE;  Surgeon: Irine Seal, MD;  Location: [facility];  Service: Urology;  Laterality: N/A;    Social History   Socioeconomic History  . Marital status: Married    Spouse name: Not on file  . Number of children: Not on file  . Years of education: Not on file  . Highest education level: Not on file  Occupational History  . Not on file  Tobacco Use  . Smoking status: Former Research scientist (life sciences)  . Smokeless tobacco: Never Used  Vaping Use  . Vaping Use:  Never used  Substance and Sexual Activity  . Alcohol use: Yes    Comment: occasional  . Drug use: Not Currently  . Sexual activity: Yes  Other Topics Concern  . Not on file  Social History Narrative  . Not on file   Social Determinants of Health   Financial Resource Strain: Not on file  Food Insecurity: Not on file  Transportation Needs: Not on file  Physical Activity: Not on file  Stress: Not on file  Social Connections: Not on file  Intimate Partner Violence: Not on file    Family History  Problem Relation Age of Onset  . Alzheimer's disease Mother   . Heart attack Father     Anti-infectives: Anti-infectives (From admission, onward)   Start     Dose/Rate Route Frequency Ordered Stop   08/18/20 1630  CIPROFLOXACIN HCL 500 MG PO TABS        500 mg Oral  Once 08/18/20 1616 08/18/20 1616      Current Outpatient Medications  Medication Sig Dispense Refill  . amLODipine (NORVASC) 10 MG tablet Take by mouth.    Marland Kitchen amlodipine-atorvastatin (CADUET) 10-10 MG tablet Take 1 tablet by mouth daily.    . budesonide-formoterol (SYMBICORT) 160-4.5 MCG/ACT inhaler Inhale 2 puffs into the lungs 2 (two) times daily.    . clopidogrel (PLAVIX) 75 MG tablet Take 75 mg by mouth daily.    Marland Kitchen Dextran 70-Hypromellose 0.1-0.3 % SOLN Place 1 drop into both eyes 2 (two) times daily.     Marland Kitchen Dextran 70-Hypromellose 0.1-0.3 % SOLN Apply to eye.    Mariane Baumgarten Sodium (DSS) 100 MG CAPS Take by mouth.    . gabapentin (NEURONTIN) 300 MG capsule Take 300 mg by mouth 3 (three) times daily.    . hydrochlorothiazide (HYDRODIURIL) 50 MG tablet Take 50 mg by mouth daily.    Marland Kitchen HYDROcodone-acetaminophen (NORCO/VICODIN) 5-325 MG tablet Take 1 tablet by mouth 2 (two) times daily as needed for pain.    Marland Kitchen lidocaine (LIDODERM) 5 % Place 1 patch onto the skin daily as needed (Pain). Remove & Discard patch within 12 hours or as directed by MD    . LORazepam (ATIVAN) 1 MG tablet Take 1 mg by mouth at bedtime.     .  methocarbamol (ROBAXIN) 500 MG tablet Take 500 mg by mouth 2 (two) times daily as  needed.    . Multiple Vitamin (MULTIVITAMIN) tablet Take 1 tablet by mouth daily.    Marland Kitchen omega-3 acid ethyl esters (LOVAZA) 1 g capsule Take by mouth daily.    . pantoprazole (PROTONIX) 20 MG tablet Take 20 mg by mouth daily.     . sildenafil (VIAGRA) 100 MG tablet Take 100 mg by mouth daily as needed for erectile dysfunction.    . tamsulosin (FLOMAX) 0.4 MG CAPS capsule Take 2 capsules (0.8 mg total) by mouth daily. 180 capsule 3  . triamcinolone cream (KENALOG) 0.5 % Apply 1 application topically daily as needed (cut/rash).      No current facility-administered medications for this visit.     Objective: Vital signs in last 24 hours: BP (!) 169/83   Pulse 92   Temp 97.8 F (36.6 C)   Ht 6' (1.829 m)   Wt 217 lb (98.4 kg)   BMI 29.43 kg/m   Intake/Output from previous day: No intake/output data recorded. Intake/Output this shift: @IOTHISSHIFT @   Physical Exam  Lab Results:  Results for orders placed or performed in visit on 08/18/20 (from the past 24 hour(s))  Urinalysis, Routine w reflex microscopic     Status: Abnormal   Collection Time: 08/18/20  3:11 PM  Result Value Ref Range   Specific Gravity, UA 1.025 1.005 - 1.030   pH, UA 5.5 5.0 - 7.5   Color, UA Amber (A) Yellow   Appearance Ur Clear Clear   Leukocytes,UA Negative Negative   Protein,UA 3+ (A) Negative/Trace   Glucose, UA Negative Negative   Ketones, UA Trace (A) Negative   RBC, UA Negative Negative   Bilirubin, UA Negative Negative   Urobilinogen, Ur 0.2 0.2 - 1.0 mg/dL   Nitrite, UA Negative Negative   Microscopic Examination See below:    Narrative   Performed at:  [facility] [address] [place], [place]  [id number] Lab Director: Mina Marble [place], Phone:  [phone]  Microscopic Examination     Status: None   Collection Time: 08/18/20  3:11 PM   Urine  Result Value Ref Range   WBC, UA None seen 0  - 5 /hpf   RBC None seen 0 - 2 /hpf   Epithelial Cells (non renal) None seen 0 - 10 /hpf   Renal Epithel, UA None seen None seen /hpf   Casts Present None seen /lpf   Cast Type Hyaline casts N/A   Mucus, UA Present Not Estab.   Bacteria, UA None seen None seen/Few   Narrative   Performed at:  [facility] [address] [place], [place]  [id number] Lab Director: [place], Phone:  [phone]    BMET No results for input(s): NA, K, CL, CO2, GLUCOSE, BUN, CREATININE, CALCIUM in the last 72 hours. PT/INR No results for input(s): LABPROT, INR in the last 72 hours. ABG No results for input(s): PHART, HCO3 in the last 72 hours.  Invalid input(s): PCO2, PO2  Studies/Results: UA is clear.  Procedure: Flexible cystoscopy.  He was prepped with betadine and 2% lidocaine jelly and Cipro 500mg  po was given.  The flexible scope was passed.  The urethral is normal.  The external sphincter is normal.  The prostate is 2-3cm with bilobar hyperplasia with some coaptation.  The bladder wall has mild trabeculation without tumors stones or inflammation.  The UO's are normal.   Assessment/Plan: Prostate cancer.  He will return in 6 weeks for labs.    He will  continue the ADT with firmagon which he started on 02/18/20 with the last on 07/18/20.  He will get a shot today and continue monthly injections.   He will need f/u in 6 months with a PSA and testosterone.    History of Bladder cancer.  He had LG NMIBC without recurrence.  He will need cystoscopy in 6 months.    BPH with BOO.  He will continue  tamsulosin 2 daily.     Meds ordered this encounter  Medications  . degarelix [facility]) injection 80 mg  . ciprofloxacin (CIPRO) tablet 500 mg     Orders Placed This Encounter  Procedures  . Microscopic Examination  . Urinalysis, Routine w reflex microscopic  . PSA    Standing Status:   Future    Standing Expiration Date:   08/18/2021  . Testosterone    Standing Status:    Future    Standing Expiration Date:   08/18/2021     Return in about 6 months (around 02/17/2021) for for cystoscopy with PSA and testosterone levels.  Cont monthly firmagon. .    CC: Dr. Jonny Ruiz.     Irine Seal 08/18/2020 [id number] ID: Isaac Bliss, male   DOB: Nov 29, 1945, 75 y.o.   MRN: [id number]

## 2020-09-20 ENCOUNTER — Ambulatory Visit (INDEPENDENT_AMBULATORY_CARE_PROVIDER_SITE_OTHER): Payer: Medicare Other

## 2020-09-20 ENCOUNTER — Other Ambulatory Visit: Payer: Self-pay

## 2020-09-20 DIAGNOSIS — C61 Malignant neoplasm of prostate: Secondary | ICD-10-CM

## 2020-09-20 MED ORDER — DEGARELIX ACETATE 80 MG ~~LOC~~ SOLR
80.0000 mg | Freq: Once | SUBCUTANEOUS | Status: AC
  Administered 2020-09-20: 80 mg via SUBCUTANEOUS

## 2020-09-20 NOTE — Progress Notes (Signed)
Firmagon Sub Q Injection  Due to Prostate Cancer patient is present today for a Firmagon Injection.   Medication: Mills Koller (Degarelix)  Dose: 80mg  Location: left upper abdomen   Patient tolerated well, no complications were noted  Performed by: Estill Bamberg RN  Follow up: 1 month NV

## 2020-10-18 ENCOUNTER — Ambulatory Visit (INDEPENDENT_AMBULATORY_CARE_PROVIDER_SITE_OTHER): Payer: Medicare Other

## 2020-10-18 ENCOUNTER — Other Ambulatory Visit: Payer: Self-pay

## 2020-10-18 DIAGNOSIS — C61 Malignant neoplasm of prostate: Secondary | ICD-10-CM

## 2020-10-18 MED ORDER — DEGARELIX ACETATE 80 MG ~~LOC~~ SOLR
80.0000 mg | Freq: Once | SUBCUTANEOUS | Status: AC
  Administered 2020-10-18: 80 mg via SUBCUTANEOUS

## 2020-10-18 NOTE — Progress Notes (Signed)
Firmagon Sub Q Injection  Due to Prostate Cancer patient is present today for a Firmagon Injection.   Medication: Mills Koller (Degarelix)  Dose: 80mg  Location: right upper abdomen Lot: [id number] Exp: 08/2022  Patient tolerated well, no complications were noted  Performed by: Lamere Lightner LPN  Follow up: Keep next scheduled NV

## 2020-11-17 ENCOUNTER — Ambulatory Visit (INDEPENDENT_AMBULATORY_CARE_PROVIDER_SITE_OTHER): Payer: Medicare Other

## 2020-11-17 ENCOUNTER — Other Ambulatory Visit: Payer: Self-pay

## 2020-11-17 DIAGNOSIS — C61 Malignant neoplasm of prostate: Secondary | ICD-10-CM

## 2020-11-17 MED ORDER — DEGARELIX ACETATE 80 MG ~~LOC~~ SOLR
80.0000 mg | Freq: Once | SUBCUTANEOUS | Status: AC
Start: 2020-11-17 — End: 2020-11-17
  Administered 2020-11-17: 80 mg via SUBCUTANEOUS

## 2020-11-17 NOTE — Progress Notes (Signed)
Firmagon Sub Q Injection  Due to Prostate Cancer patient is present today for a Firmagon Injection.   Medication: Mills Koller (Degarelix)  Dose: '80mg'$  Location: left upper abdomen Lot: [id number] Exp: 10/2022  Patient tolerated well, no complications were noted  Performed by: Shaquera Ansley LPN  Follow up: Keep next scheduled NV

## 2020-12-15 ENCOUNTER — Other Ambulatory Visit: Payer: Self-pay

## 2020-12-15 ENCOUNTER — Ambulatory Visit (INDEPENDENT_AMBULATORY_CARE_PROVIDER_SITE_OTHER): Payer: Medicare Other

## 2020-12-15 DIAGNOSIS — C61 Malignant neoplasm of prostate: Secondary | ICD-10-CM

## 2020-12-15 MED ORDER — DEGARELIX ACETATE 80 MG ~~LOC~~ SOLR
80.0000 mg | Freq: Once | SUBCUTANEOUS | Status: AC
  Administered 2020-12-15: 80 mg via SUBCUTANEOUS

## 2020-12-15 NOTE — Progress Notes (Signed)
Firmagon Sub Q Injection  Due to Prostate Cancer patient is present today for a Firmagon Injection.   Medication: Mills Koller (Degarelix)  Dose: '80mg'$  Location: right upper abdomen Lot: [id number] Exp: 11/2022  Patient tolerated well, no complications were noted  Performed by: Woodie Degraffenreid LPN  Follow up: Keep next scheduled NV

## 2021-01-12 ENCOUNTER — Ambulatory Visit (INDEPENDENT_AMBULATORY_CARE_PROVIDER_SITE_OTHER): Payer: Medicare Other

## 2021-01-12 ENCOUNTER — Other Ambulatory Visit: Payer: Self-pay

## 2021-01-12 DIAGNOSIS — C61 Malignant neoplasm of prostate: Secondary | ICD-10-CM | POA: Diagnosis not present

## 2021-01-12 MED ORDER — DEGARELIX ACETATE 80 MG ~~LOC~~ SOLR
80.0000 mg | Freq: Once | SUBCUTANEOUS | Status: AC
  Administered 2021-01-12: 80 mg via SUBCUTANEOUS

## 2021-01-12 NOTE — Progress Notes (Signed)
Firmagon Sub Q Injection  Due to Prostate Cancer patient is present today for a Firmagon Injection.   Medication: Mills Koller (Degarelix)  Dose: 80mg  Location: left upper abdomen Lot: [id number] Exp: T1 2024  Patient tolerated well, no complications were noted  Performed by: Lyndol Vanderheiden LPN  Follow up:1 month NV w/ labs

## 2021-02-16 ENCOUNTER — Ambulatory Visit (INDEPENDENT_AMBULATORY_CARE_PROVIDER_SITE_OTHER): Payer: Medicare Other

## 2021-02-16 ENCOUNTER — Other Ambulatory Visit: Payer: Self-pay

## 2021-02-16 DIAGNOSIS — C61 Malignant neoplasm of prostate: Secondary | ICD-10-CM | POA: Diagnosis not present

## 2021-02-16 MED ORDER — DEGARELIX ACETATE 80 MG ~~LOC~~ SOLR
80.0000 mg | Freq: Once | SUBCUTANEOUS | Status: AC
  Administered 2021-02-16: 80 mg via SUBCUTANEOUS

## 2021-02-16 NOTE — Addendum Note (Signed)
Addended by: Wyline Mood on: 02/16/2021 09:21 AM   Modules accepted: Orders

## 2021-02-16 NOTE — Progress Notes (Signed)
Firmagon Sub Q Injection  Due to Prostate Cancer patient is present today for a Firmagon Injection.   Medication: Firmagon (Degarelix)  Dose: 80mg Location: right upper abdomen   Patient tolerated well, no complications were noted  Performed by: Elisabel Hanover LPN   Follow up: keep scheduled OV 

## 2021-02-17 LAB — PSA: Prostate Specific Ag, Serum: <0.1 ng/mL (ref 0.0–4.0)

## 2021-02-17 LAB — TESTOSTERONE: Testosterone: <3 ng/dL — ABNORMAL LOW (ref 264–916)

## 2021-02-23 ENCOUNTER — Other Ambulatory Visit: Payer: Medicare Other | Admitting: Urology

## 2021-06-01 ENCOUNTER — Other Ambulatory Visit: Payer: Self-pay

## 2021-06-01 ENCOUNTER — Ambulatory Visit (INDEPENDENT_AMBULATORY_CARE_PROVIDER_SITE_OTHER): Payer: Medicare Other | Admitting: Urology

## 2021-06-01 ENCOUNTER — Encounter: Payer: Self-pay | Admitting: Urology

## 2021-06-01 VITALS — BP 136/78 | HR 102 | Wt 212.0 lb

## 2021-06-01 DIAGNOSIS — R350 Frequency of micturition: Secondary | ICD-10-CM

## 2021-06-01 DIAGNOSIS — C61 Malignant neoplasm of prostate: Secondary | ICD-10-CM

## 2021-06-01 DIAGNOSIS — Z8551 Personal history of malignant neoplasm of bladder: Secondary | ICD-10-CM

## 2021-06-01 DIAGNOSIS — N401 Enlarged prostate with lower urinary tract symptoms: Secondary | ICD-10-CM

## 2021-06-01 DIAGNOSIS — N3040 Irradiation cystitis without hematuria: Secondary | ICD-10-CM

## 2021-06-01 DIAGNOSIS — N138 Other obstructive and reflux uropathy: Secondary | ICD-10-CM

## 2021-06-01 DIAGNOSIS — R3915 Urgency of urination: Secondary | ICD-10-CM

## 2021-06-01 DIAGNOSIS — R351 Nocturia: Secondary | ICD-10-CM

## 2021-06-01 DIAGNOSIS — N3941 Urge incontinence: Secondary | ICD-10-CM

## 2021-06-01 LAB — MICROSCOPIC EXAMINATION
Bacteria, UA: NONE SEEN
Epithelial Cells (non renal): NONE SEEN /hpf (ref 0–10)
Renal Epithel, UA: NONE SEEN /hpf
WBC, UA: NONE SEEN /hpf (ref 0–5)

## 2021-06-01 LAB — URINALYSIS, ROUTINE W REFLEX MICROSCOPIC
Bilirubin, UA: NEGATIVE
Glucose, UA: NEGATIVE
Ketones, UA: NEGATIVE
Leukocytes,UA: NEGATIVE
Nitrite, UA: NEGATIVE
Specific Gravity, UA: 1.025 (ref 1.005–1.030)
Urobilinogen, Ur: 0.2 mg/dL (ref 0.2–1.0)
pH, UA: 5.5 (ref 5.0–7.5)

## 2021-06-01 MED ORDER — CIPROFLOXACIN HCL 500 MG PO TABS
500.0000 mg | ORAL_TABLET | Freq: Once | ORAL | Status: AC
  Administered 2021-06-01: 500 mg via ORAL

## 2021-06-01 MED ORDER — TAMSULOSIN HCL 0.4 MG PO CAPS: 0.8000 mg | ORAL_CAPSULE | Freq: Every day | ORAL | 3 refills | Status: DC

## 2021-06-01 NOTE — Progress Notes (Signed)
Subjective: 1. Prostate cancer ([place])   2. History of bladder cancer   3. BPH with urinary obstruction   4. Nocturia   5. Urinary frequency   6. Urgency of urination   7. Urge incontinence   8. Radiation cystitis     06/01/21: Barry Alvarado return today in f/u for the history below.  He cancelled his last appoint because of COVID and is overdue for f/u.  His PSA was <0.1 in 10/22 and his T was < 3.  He got his last Firmagon on 02/16/21.  He was treated for a UTI about 2 weeks ago.  He was having dysuria that has resolved.  He has no hematuria.   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   He remains on tamsulosin 0.8mg  daily and has sildenafil for ED but it is not working too well.   06/16/20: Barry Alvarado returns today in f/u.  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  He completed radiation therapy on 06/13/20.   He has no hematuria.  He is voiding ok with an IPSS of 19.  He has nocturia x 2 and an occasional sensation of incomplete emptying.  He has some intermittency.  He has some urgency with occasional UUI.   UA is clear today.  He remains on tamsulosin but is up to bid.   03/04/20: Barry Alvarado return today follow a TURBT.  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  He is doing well without hematuria.  His UA is unremarkable.  He has an IPSS of 14.  He has a good stream but has some nocturia.  He can have some hesitancy.   He got his initial firmagon on 02/18/20.   He had raised knots on his abdomen.  He is to get marked for EXRT on 04/19/20 at [facility]    02/05/20: Barry Alvarado returns today in f/u to discuss his recent prostate biopsy on 01/22/20.   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  4/12 cores had Gleason 8 and a total of 9/12 cores are involved with prostate cancer.   He had a negative bone scan on 02/02/20 and a CT hematuria study on 01/19/20 showed no  mets.   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  His prostate volume is 8ml. He has 3-10 RBC's on UA today and needs cystoscopy.    GU Hx: Barry Alvarado is a 76 yo male who is sent in consultation by Dr. Jonny Alvarado for an elevated PSA with a further rise on a repeat.  I am unable to get the levels as they were not in the referral documents and we are unable to reach anyone in the referring office.  The patient is unaware of the levels.   He has had some issues with voiding and is on tamsulosin which has been helping.  He has nocturia x 1-2.  He had frequency but that has improved on tamsulosin.  He has urgency and has had UUI but that has improved.  He has a variable stream.  He feels like he doesn't empty completely.  He has no dysuria or hematuria.  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   He has had no UTI's but he may have had stones in the past but no GU surgery.  He has  some issues with ED and has been given sildenafil.  He is a former smoker but none in 12 years.    ROS:  Review of Systems  Respiratory:  Positive for shortness of breath.   Gastrointestinal:  Positive for diarrhea.  Genitourinary:  Positive for frequency and urgency. Negative for dysuria and hematuria.       UUI and he is wearing pads.  Nocturia x 2-7   Musculoskeletal:  Positive for back pain.  All other systems reviewed and are negative.  Allergies  Allergen Reactions   Penicillins Hives    Reaction: Childhood   Atorvastatin Other (See Comments)    Stomach burn   Omeprazole Other (See Comments)    unknown   Shellfish Allergy Swelling    Numb face   Tramadol Other (See Comments)    unknown   Zoloft [Sertraline] Other (See Comments)    unknown    Past Medical History:  Diagnosis Date   Anxiety    CAD (coronary artery disease)    pt reports stent placed ~2011   Cancer [facility])    Prostate, bladder   COPD (chronic obstructive pulmonary disease) ([place])    CVA (cerebral vascular accident) ([place]) 2017    He has had 5 strokes.    Erectile dysfunction due to arterial insufficiency    GERD (gastroesophageal reflux disease)    Hypertension     Past Surgical History:  Procedure Laterality Date   CARDIAC CATHETERIZATION  2011   with stent   CHOLECYSTECTOMY  4-5 yrs ago   CYSTOSCOPY  01/29/2020   bladder   TRANSURETHRAL RESECTION OF BLADDER TUMOR WITH MITOMYCIN-C N/A 02/25/2020   Procedure: TRANSURETHRAL RESECTION OF BLADDER TUMOR WITH GEMCITABINE;  Surgeon: Barry Seal, MD;  Location: [facility];  Service: Urology;  Laterality: N/A;    Social History   Socioeconomic History   Marital status: Married    Spouse name: Not on file   Number of children: Not on file   Years of education: Not on file   Highest education level: Not on file  Occupational History   Not on file  Tobacco Use   Smoking status: Former   Smokeless tobacco: Never  Vaping Use   Vaping Use: Never used  Substance and Sexual Activity   Alcohol use: Yes    Comment: occasional   Drug use: Not Currently   Sexual activity: Yes  Other Topics Concern   Not on file  Social History Narrative   Not on file   Social Determinants of Health   Financial Resource Strain: Not on file  Food Insecurity: Not on file  Transportation Needs: Not on file  Physical Activity: Not on file  Stress: Not on file  Social Connections: Not on file  Intimate Partner Violence: Not on file    Family History  Problem Relation Age of Onset   Alzheimer's disease Mother    Heart attack Father     Anti-infectives: Anti-infectives (From admission, onward)    Start     Dose/Rate Route Frequency Ordered Stop   06/01/21 1000  CIPROFLOXACIN HCL 500 MG PO TABS        500 mg Oral  Once 06/01/21 0958 06/01/21 0959       Current Outpatient Medications  Medication Sig Dispense Refill   amLODipine (NORVASC) 10 MG tablet Take by mouth.     amlodipine-atorvastatin (CADUET) 10-10 MG tablet Take 1 tablet by mouth daily.      budesonide-formoterol (SYMBICORT) 160-4.5 MCG/ACT inhaler Inhale 2 puffs  into the lungs 2 (two) times daily.     clopidogrel (PLAVIX) 75 MG tablet Take 75 mg by mouth daily.     Dextran 70-Hypromellose 0.1-0.3 % SOLN Place 1 drop into both eyes 2 (two) times daily.      Dextran 70-Hypromellose 0.1-0.3 % SOLN Apply to eye.     Docusate Sodium (DSS) 100 MG CAPS Take by mouth.     gabapentin (NEURONTIN) 300 MG capsule Take 300 mg by mouth 3 (three) times daily.     hydrochlorothiazide (HYDRODIURIL) 50 MG tablet Take 50 mg by mouth daily.     HYDROcodone-acetaminophen (NORCO/VICODIN) 5-325 MG tablet Take 1 tablet by mouth 2 (two) times daily as needed for pain.     lidocaine (LIDODERM) 5 % Place 1 patch onto the skin daily as needed (Pain). Remove & Discard patch within 12 hours or as directed by MD     LORazepam (ATIVAN) 1 MG tablet Take 1 mg by mouth at bedtime.      methocarbamol (ROBAXIN) 500 MG tablet Take 500 mg by mouth 2 (two) times daily as needed.     Multiple Vitamin (MULTIVITAMIN) tablet Take 1 tablet by mouth daily.     omega-3 acid ethyl esters (LOVAZA) 1 g capsule Take by mouth daily.     pantoprazole (PROTONIX) 20 MG tablet Take 20 mg by mouth daily.      sildenafil (VIAGRA) 100 MG tablet Take 100 mg by mouth daily as needed for erectile dysfunction.     triamcinolone cream (KENALOG) 0.5 % Apply 1 application topically daily as needed (cut/rash).      tamsulosin (FLOMAX) 0.4 MG CAPS capsule Take 2 capsules (0.8 mg total) by mouth daily. 180 capsule 3   No current facility-administered medications for this visit.     Objective: Vital signs in last 24 hours: BP 136/78    Pulse (!) 102    Wt 212 lb (96.2 kg)    BMI 28.75 kg/m   Intake/Output from previous day: No intake/output data recorded. Intake/Output this shift: @IOTHISSHIFT @   Physical Exam  Lab Results:  Results for orders placed or performed in visit on 06/01/21 (from the past 24 hour(s))  Urinalysis, Routine w  reflex microscopic     Status: Abnormal   Collection Time: 06/01/21 10:07 AM  Result Value Ref Range   Specific Gravity, UA 1.025 1.005 - 1.030   pH, UA 5.5 5.0 - 7.5   Color, UA Yellow Yellow   Appearance Ur Clear Clear   Leukocytes,UA Negative Negative   Protein,UA 3+ (A) Negative/Trace   Glucose, UA Negative Negative   Ketones, UA Negative Negative   RBC, UA Trace (A) Negative   Bilirubin, UA Negative Negative   Urobilinogen, Ur 0.2 0.2 - 1.0 mg/dL   Nitrite, UA Negative Negative   Microscopic Examination See below:    Narrative   Performed at:  [facility] [address] [place], [place]  [id number] Lab Director: Mina Marble MT, Phone:  [phone]  Microscopic Examination     Status: None   Collection Time: 06/01/21 10:07 AM   Urine  Result Value Ref Range   WBC, UA None seen 0 - 5 /hpf   RBC 0-2 0 - 2 /hpf   Epithelial Cells (non renal) None seen 0 - 10 /hpf   Renal Epithel, UA None seen None seen /hpf   Mucus, UA Present Not Estab.   Bacteria, UA None seen None seen/Few   Narrative   Performed at:  [facility] [address] [place], [place]  [id number] Lab Director: [place], Phone:  [phone]     BMET No results for input(s): NA, K, CL, CO2, GLUCOSE, BUN, CREATININE, CALCIUM in the last 72 hours. PT/INR No results for input(s): LABPROT, INR in the last 72 hours. ABG No results for input(s): PHART, HCO3 in the last 72 hours.  Invalid input(s): PCO2, PO2  Studies/Results: UA is clear.  Procedure: Flexible cystoscopy.  He was prepped with betadine and 2% lidocaine jelly and Cipro 500mg  po was given.  The flexible scope was passed.  The urethral is normal.  The external sphincter is normal.  The prostate is 2-3cm with bilobar hyperplasia with some coaptation.  The bladder wall has moderate trabeculation without tumors or stones but there is significant radiation cystitis with some hemorrhagic changes at the bladder  neck.  The UO's are normal.   Assessment/Plan: Prostate cancer.  His PSA and testosterone were low in 10/22 and he is due for labs today and in 6 months.   He received only 12 of 18 months of ADT but I will see where his testosterone level is now.     History of Bladder cancer.  He had LG NMIBC without recurrence.  He will need cystoscopy in 6 months.    BPH with BOO.  He will continue  tamsulosin 2 daily.   Radiation cystitis with OAB wet.   I will give him Myrbetriq 25mg  and have him return in 4-6 weeks for reevaluation.     Meds ordered this encounter  Medications   tamsulosin (FLOMAX) 0.4 MG CAPS capsule    Sig: Take 2 capsules (0.8 mg total) by mouth daily.    Dispense:  180 capsule    Refill:  3   ciprofloxacin (CIPRO) tablet 500 mg     Orders Placed This Encounter  Procedures   Microscopic Examination   Urinalysis, Routine w reflex microscopic   PSA   Testosterone   PSA    Standing Status:   Future    Standing Expiration Date:   06/01/2022   Testosterone    Standing Status:   Future    Standing Expiration Date:   06/01/2022     Return in about 4 weeks (around 06/29/2021) for F/u in 4 weeks with Sharee Pimple to assess response to Myrbetriq. Then 6 months with me for cystoscopy.    CC: Dr. Jonny Alvarado.     Barry Alvarado 06/02/2021 [phone] ID: Isaac Bliss, male   DOB: [birth year], 76 y.o.   MRN: [id number]

## 2021-06-29 ENCOUNTER — Ambulatory Visit: Payer: Medicare Other | Admitting: Urology

## 2021-06-29 ENCOUNTER — Encounter: Payer: Self-pay | Admitting: Urology

## 2021-06-29 ENCOUNTER — Other Ambulatory Visit: Payer: Self-pay

## 2021-06-29 VITALS — BP 147/76 | HR 89

## 2021-06-29 DIAGNOSIS — R3915 Urgency of urination: Secondary | ICD-10-CM

## 2021-06-29 DIAGNOSIS — C61 Malignant neoplasm of prostate: Secondary | ICD-10-CM

## 2021-06-29 DIAGNOSIS — N3941 Urge incontinence: Secondary | ICD-10-CM

## 2021-06-29 DIAGNOSIS — R350 Frequency of micturition: Secondary | ICD-10-CM

## 2021-06-29 DIAGNOSIS — N138 Other obstructive and reflux uropathy: Secondary | ICD-10-CM

## 2021-06-29 DIAGNOSIS — N401 Enlarged prostate with lower urinary tract symptoms: Secondary | ICD-10-CM

## 2021-06-29 DIAGNOSIS — R351 Nocturia: Secondary | ICD-10-CM

## 2021-06-29 MED ORDER — MIRABEGRON ER 50 MG PO TB24: 50.0000 mg | ORAL_TABLET | Freq: Every day | ORAL | 11 refills | Status: DC

## 2021-06-29 NOTE — Progress Notes (Signed)
Subjective: 1. BPH with urinary obstruction   2. Urge incontinence   3. Nocturia   4. Urinary frequency   5. Urgency of urination   6. Prostate cancer ([place])     06/29/21: [name] returns today in f/u to assess his response to Myrbetriq.  He has had a benefit of the '50mg'$  dose with reduced frequency and reduced incontinence.  He has nocturia x 4-5 but doesn't wet the bed.   He has  no hematuria or dysuria.   He didn't get labs in February and will need those today.  He remains on tamsulosin and sildenafil.  06/01/21: [name] return today in f/u for the history below.  He cancelled his last appoint because of COVID and is overdue for f/u.  His PSA was <0.1 in 10/22 and his T was < 3.  He got his last Firmagon on 02/16/21.  He was treated for a UTI about 2 weeks ago.  He was having dysuria that has resolved.  He has no hematuria.   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   He remains on tamsulosin 0.'8mg'$  daily and has sildenafil for ED but it is not working too well.   06/16/20: [name] returns today in f/u.  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  He completed radiation therapy on 06/13/20.   He has no hematuria.  He is voiding ok with an IPSS of 19.  He has nocturia x 2 and an occasional sensation of incomplete emptying.  He has some intermittency.  He has some urgency with occasional UUI.   UA is clear today.  He remains on tamsulosin but is up to bid.   03/04/20: [name] return today follow a TURBT.  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  He is doing well without hematuria.  His UA is unremarkable.  He has an IPSS of 14.  He has a good stream but has some nocturia.  He can have some hesitancy.   He got his initial firmagon on 02/18/20.   He had raised knots on his abdomen.  He is to get marked for EXRT on 04/19/20 at [facility].    02/05/20: [name] returns today in f/u to discuss his recent  prostate biopsy on 01/22/20.   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  4/12 cores had Gleason 8 and a total of 9/12 cores are involved with prostate cancer.   He had a negative bone scan on 02/02/20 and a CT hematuria study on 01/19/20 showed no mets.   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  His prostate volume is 81m. He has 3-10 RBC's on UA today and needs cystoscopy.    GU Hx: [name] a 76yo male who is sent in consultation by Dr. CJonny Ruizfor an elevated PSA with a further rise on a repeat.  I am unable to get the levels as they were not in the referral documents and we are unable to reach anyone in the referring office.  The patient is unaware of the levels.   He has had some issues with voiding and is on tamsulosin which has been helping.  He has nocturia x 1-2.  He had frequency but that has improved on tamsulosin.  He has urgency and has had UUI  but that has improved.  He has a variable stream.  He feels like he doesn't empty completely.  He has no dysuria or hematuria.  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   He has had no UTI's but he may have had stones in the past but no GU surgery.  He has some issues with ED and has been given sildenafil.  He is a former smoker but none in 12 years.      IPSS     Row Name 06/29/21 0900         International Prostate Symptom Score   How often have you had the sensation of not emptying your bladder? Less than half the time     How often have you had to urinate less than every two hours? More than half the time     How often have you found you stopped and started again several times when you urinated? About half the time     How often have you found it difficult to postpone urination? Almost always     How often have you had a weak urinary stream? Less than 1 in 5 times     How often have you had to strain to start urination? Not at All     How many times did you typically  get up at night to urinate? 4 Times     Total IPSS Score 19       Quality of Life due to urinary symptoms   If you were to spend the rest of your life with your urinary condition just the way it is now how would you feel about that? Mixed              ROS:  Review of Systems  Eyes:  Positive for blurred vision.  Respiratory:  Positive for shortness of breath.   Cardiovascular:  Positive for leg swelling.  Gastrointestinal:  Positive for diarrhea.  Genitourinary:  Positive for frequency and urgency. Negative for dysuria and hematuria.       UUI and he is wearing pads.  Nocturia x 2-7   Musculoskeletal:  Positive for back pain.  All other systems reviewed and are negative.  Allergies  Allergen Reactions   Penicillins Hives    Reaction: Childhood   Atorvastatin Other (See Comments)    Stomach burn   Omeprazole Other (See Comments)    unknown   Shellfish Allergy Swelling    Numb face   Tramadol Other (See Comments)    unknown   Zoloft [Sertraline] Other (See Comments)    unknown    Past Medical History:  Diagnosis Date   Anxiety    CAD (coronary artery disease)    pt reports stent placed ~2011   Cancer [facility]    Prostate, bladder   COPD (chronic obstructive pulmonary disease) ([place])    CVA (cerebral vascular accident) ([place]) 2017   He has had 5 strokes.    Erectile dysfunction due to arterial insufficiency    GERD (gastroesophageal reflux disease)    Hypertension     Past Surgical History:  Procedure Laterality Date   CARDIAC CATHETERIZATION  2011   with stent   CHOLECYSTECTOMY  4-5 yrs ago   CYSTOSCOPY  01/29/2020   bladder   TRANSURETHRAL RESECTION OF BLADDER TUMOR WITH MITOMYCIN-C N/A 02/25/2020   Procedure: TRANSURETHRAL RESECTION OF BLADDER TUMOR WITH GEMCITABINE;  Surgeon: Irine Seal, MD;  Location: [facility];  Service: Urology;  Laterality: N/A;  Social History   Socioeconomic History   Marital status: Married    Spouse name: Not on file    Number of children: Not on file   Years of education: Not on file   Highest education level: Not on file  Occupational History   Not on file  Tobacco Use   Smoking status: Former   Smokeless tobacco: Never  Vaping Use   Vaping Use: Never used  Substance and Sexual Activity   Alcohol use: Yes    Comment: occasional   Drug use: Not Currently   Sexual activity: Yes  Other Topics Concern   Not on file  Social History Narrative   Not on file   Social Determinants of Health   Financial Resource Strain: Not on file  Food Insecurity: Not on file  Transportation Needs: Not on file  Physical Activity: Not on file  Stress: Not on file  Social Connections: Not on file  Intimate Partner Violence: Not on file    Family History  Problem Relation Age of Onset   Alzheimer's disease Mother    Heart attack Father     Anti-infectives: Anti-infectives (From admission, onward)    None       Current Outpatient Medications  Medication Sig Dispense Refill   mirabegron ER (MYRBETRIQ) 50 MG TB24 tablet Take 1 tablet (50 mg total) by mouth daily. 30 tablet 11   amLODipine (NORVASC) 10 MG tablet Take by mouth.     amlodipine-atorvastatin (CADUET) 10-10 MG tablet Take 1 tablet by mouth daily.     budesonide-formoterol (SYMBICORT) 160-4.5 MCG/ACT inhaler Inhale 2 puffs into the lungs 2 (two) times daily.     clopidogrel (PLAVIX) 75 MG tablet Take 75 mg by mouth daily.     CREON 36000-114000 units CPEP capsule Take by mouth.     Dextran 70-Hypromellose 0.1-0.3 % SOLN Place 1 drop into both eyes 2 (two) times daily.      Dextran 70-Hypromellose 0.1-0.3 % SOLN Apply to eye.     Docusate Sodium (DSS) 100 MG CAPS Take by mouth.     gabapentin (NEURONTIN) 300 MG capsule Take 300 mg by mouth 3 (three) times daily.     hydrochlorothiazide (HYDRODIURIL) 50 MG tablet Take 50 mg by mouth daily.     HYDROcodone-acetaminophen (NORCO/VICODIN) 5-325 MG tablet Take 1 tablet by mouth 2 (two) times daily as  needed for pain.     lidocaine (LIDODERM) 5 % Place 1 patch onto the skin daily as needed (Pain). Remove & Discard patch within 12 hours or as directed by MD     LORazepam (ATIVAN) 1 MG tablet Take 1 mg by mouth at bedtime.      methocarbamol (ROBAXIN) 500 MG tablet Take 500 mg by mouth 2 (two) times daily as needed.     Multiple Vitamin (MULTIVITAMIN) tablet Take 1 tablet by mouth daily.     omega-3 acid ethyl esters (LOVAZA) 1 g capsule Take by mouth daily.     pantoprazole (PROTONIX) 20 MG tablet Take 20 mg by mouth daily.      sildenafil (VIAGRA) 100 MG tablet Take 100 mg by mouth daily as needed for erectile dysfunction.     tamsulosin (FLOMAX) 0.4 MG CAPS capsule Take 2 capsules (0.8 mg total) by mouth daily. 180 capsule 3   triamcinolone cream (KENALOG) 0.5 % Apply 1 application topically daily as needed (cut/rash).      No current facility-administered medications for this visit.     Objective: Vital signs in  last 24 hours: BP (!) 147/76    Pulse 89   Intake/Output from previous day: No intake/output data recorded. Intake/Output this shift: '@IOTHISSHIFT'$ @   Physical Exam  Lab Results:  No results found for this or any previous visit (from the past 24 hour(s)).    BMET No results for input(s): NA, K, CL, CO2, GLUCOSE, BUN, CREATININE, CALCIUM in the last 72 hours. PT/INR No results for input(s): LABPROT, INR in the last 72 hours. ABG No results for input(s): PHART, HCO3 in the last 72 hours.  Invalid input(s): PCO2, PO2  Studies/Results:   Assessment/Plan: Prostate cancer.  His PSA and testosterone were low in 10/22 and he didn't get labs in Feb as ordered but will have them today and then in August   He received only 12 of 18 months of ADT but I will see where his testosterone level is now.     History of Bladder cancer.  He had LG NMIBC without recurrence.  No hematuria.  Cystoscopy in August.  BPH with BOO.  He will continue  tamsulosin 2 daily.   Radiation  cystitis with OAB wet.   He responded to Myrbetriq '50mg'$  so I have given him additional samples and sent a script.  ED.  He continues Sildenafil.    Meds ordered this encounter  Medications   mirabegron ER (MYRBETRIQ) 50 MG TB24 tablet    Sig: Take 1 tablet (50 mg total) by mouth daily.    Dispense:  30 tablet    Refill:  11     Orders Placed This Encounter  Procedures   Urinalysis, Routine w reflex microscopic   PSA   Testosterone     Return for He should have f/u in August with a PSA and testosterone and for cystoscopy..    CC: Dr. Jonny Ruiz.     Irine Seal 06/30/2021 [phone] ID: Barry Alvarado, male   DOB: [birth year], 76 y.o.   MRN: [id number]

## 2021-06-30 LAB — PSA: Prostate Specific Ag, Serum: <0.1 ng/mL (ref 0.0–4.0)

## 2021-06-30 LAB — TESTOSTERONE: Testosterone: 39 ng/dL — ABNORMAL LOW (ref 264–916)

## 2021-06-30 NOTE — Progress Notes (Signed)
Sent via mail 

## 2021-07-25 ENCOUNTER — Other Ambulatory Visit: Payer: Self-pay | Admitting: Urology

## 2021-10-23 ENCOUNTER — Telehealth: Payer: Self-pay | Admitting: Plastic Surgery

## 2021-10-23 NOTE — Telephone Encounter (Signed)
LVM to callback and schedule consult with Dr. Claudia Desanctis for reconstruction after moh's advised him to callback to schedule.

## 2021-11-08 ENCOUNTER — Institutional Professional Consult (permissible substitution): Payer: Medicare Other | Admitting: Plastic Surgery

## 2021-11-17 ENCOUNTER — Ambulatory Visit: Payer: Medicare Other | Admitting: Plastic Surgery

## 2021-11-17 VITALS — BP 117/64 | HR 94 | Ht 73.0 in | Wt 215.6 lb

## 2021-11-17 DIAGNOSIS — C44311 Basal cell carcinoma of skin of nose: Secondary | ICD-10-CM

## 2021-11-17 DIAGNOSIS — Z7901 Long term (current) use of anticoagulants: Secondary | ICD-10-CM

## 2021-11-17 NOTE — Progress Notes (Signed)
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 male.  HPI: Patient is a 76 year old who is having Mohs on September 6.  He is having this Monday dermatologist in [place], [place].  He is interested in having reconstruction subsequent to his Mohs surgery.  The patient is on Plavix for stroke but states he cannot be taken off.    Allergies  Allergen Reactions   Penicillins Hives    Reaction: Childhood   Atorvastatin Other (See Comments)    Stomach burn   Omeprazole Other (See Comments)    unknown   Shellfish Allergy Swelling    Numb face   Tramadol Other (See Comments)    unknown   Zoloft [Sertraline] Other (See Comments)    unknown    Outpatient Encounter Medications as of 11/17/2021  Medication Sig   amLODipine (NORVASC) 10 MG tablet Take by mouth.   clopidogrel (PLAVIX) 75 MG tablet Take 75 mg by mouth daily.   Docusate Sodium (DSS) 100 MG CAPS Take by mouth.   gabapentin (NEURONTIN) 300 MG capsule Take 300 mg by mouth 3 (three) times daily.   HYDROcodone-acetaminophen (NORCO/VICODIN) 5-325 MG tablet Take 1 tablet by mouth 2 (two) times daily as needed for pain.   lidocaine (LIDODERM) 5 % Place 1 patch onto the skin daily as needed (Pain). Remove & Discard patch within 12 hours or as directed by MD   methocarbamol (ROBAXIN) 500 MG tablet Take 500 mg by mouth 2 (two) times daily as needed.   mirabegron ER (MYRBETRIQ) 50 MG TB24 tablet Take 1 tablet (50 mg total) by mouth daily.   Multiple Vitamin (MULTIVITAMIN) tablet Take 1 tablet by mouth daily.   omega-3 acid ethyl esters (LOVAZA) 1 g capsule Take by mouth daily.   pantoprazole (PROTONIX) 20 MG tablet Take 20 mg by mouth daily.    tamsulosin (FLOMAX) 0.4 MG CAPS capsule TAKE 2 CAPSULES(0.8 MG) BY MOUTH DAILY   triamcinolone cream (KENALOG) 0.5 % Apply 1 application  topically daily as needed (cut/rash).    sildenafil (VIAGRA) 100 MG tablet Take 100 mg by mouth daily as needed for erectile dysfunction.   [DISCONTINUED] amlodipine-atorvastatin (CADUET) 10-10 MG tablet Take 1 tablet by mouth daily.   [DISCONTINUED] budesonide-formoterol (SYMBICORT) 160-4.5 MCG/ACT inhaler Inhale 2 puffs into the lungs 2 (two) times daily.   [DISCONTINUED] CREON 36000-114000 units CPEP capsule Take by mouth.   [DISCONTINUED] Dextran 70-Hypromellose 0.1-0.3 % SOLN Place 1 drop into both eyes 2 (two) times daily.    [DISCONTINUED] Dextran 70-Hypromellose 0.1-0.3 % SOLN Apply to eye.   [DISCONTINUED] hydrochlorothiazide (HYDRODIURIL) 50 MG tablet Take 50 mg by mouth daily.   [DISCONTINUED] LORazepam (ATIVAN) 1 MG tablet Take 1 mg by mouth at bedtime.    No facility-administered encounter medications on file as of 11/17/2021.     Past Medical History:  Diagnosis Date   Anxiety    CAD (coronary artery disease)    pt reports stent placed ~2011   Cancer [facility])    Prostate, bladder   COPD (chronic obstructive pulmonary disease) ([place])    CVA (cerebral vascular accident) ([place]) 2017   He has had 5 strokes.    Erectile dysfunction due to arterial insufficiency    GERD (gastroesophageal reflux disease)    Hypertension     Past Surgical History:  Procedure Laterality Date  CARDIAC CATHETERIZATION  2011   with stent   CHOLECYSTECTOMY  4-5 yrs ago   CYSTOSCOPY  01/29/2020   bladder   TRANSURETHRAL RESECTION OF BLADDER TUMOR WITH MITOMYCIN-C N/A 02/25/2020   Procedure: TRANSURETHRAL RESECTION OF BLADDER TUMOR WITH GEMCITABINE;  Surgeon: Irine Seal, MD;  Location: [facility];  Service: Urology;  Laterality: N/A;    Family History  Problem Relation Age of Onset   Alzheimer's disease Mother    Heart attack Father     Social History   Social History Narrative   Not on file     Review of Systems General: Denies fevers, chills, weight loss CV: Denies chest pain, shortness  of breath, palpitations   Physical Exam    11/17/2021    2:25 PM 06/29/2021    9:10 AM 06/01/2021    9:17 AM  Vitals with BMI  Height '6\' 1"'$     Weight 215 lbs 10 oz  212 lbs  BMI 29.51    Systolic 884 166 063  Diastolic 64 76 78  Pulse 94 89 102    General:  No acute distress,  Alert and oriented, Non-Toxic, Normal speech and affect HEENT: Subtle lesion of the left nose above the nasal ala.  Assessment/Plan We will plan Mohs reconstruction shortly after September 6 with local tissue rearrangement versus skin graft versus skin graft substitute.  Patient is aware of bleeding risk with Plavix but due to his history of 5 strokes he has been told he cannot stop this even temporarily.    Lennice Sites 11/17/2021, 2:48 PM

## 2021-11-30 ENCOUNTER — Ambulatory Visit (INDEPENDENT_AMBULATORY_CARE_PROVIDER_SITE_OTHER): Payer: Medicare Other | Admitting: Urology

## 2021-11-30 ENCOUNTER — Encounter: Payer: Self-pay | Admitting: Urology

## 2021-11-30 VITALS — BP 124/70 | HR 79

## 2021-11-30 DIAGNOSIS — Z8546 Personal history of malignant neoplasm of prostate: Secondary | ICD-10-CM

## 2021-11-30 DIAGNOSIS — N3040 Irradiation cystitis without hematuria: Secondary | ICD-10-CM

## 2021-11-30 DIAGNOSIS — N401 Enlarged prostate with lower urinary tract symptoms: Secondary | ICD-10-CM | POA: Diagnosis not present

## 2021-11-30 DIAGNOSIS — N3281 Overactive bladder: Secondary | ICD-10-CM

## 2021-11-30 DIAGNOSIS — Z8551 Personal history of malignant neoplasm of bladder: Secondary | ICD-10-CM

## 2021-11-30 DIAGNOSIS — R7989 Other specified abnormal findings of blood chemistry: Secondary | ICD-10-CM

## 2021-11-30 DIAGNOSIS — C61 Malignant neoplasm of prostate: Secondary | ICD-10-CM

## 2021-11-30 DIAGNOSIS — N138 Other obstructive and reflux uropathy: Secondary | ICD-10-CM | POA: Diagnosis not present

## 2021-11-30 DIAGNOSIS — R351 Nocturia: Secondary | ICD-10-CM

## 2021-11-30 DIAGNOSIS — N529 Male erectile dysfunction, unspecified: Secondary | ICD-10-CM

## 2021-11-30 DIAGNOSIS — N3941 Urge incontinence: Secondary | ICD-10-CM

## 2021-11-30 LAB — MICROSCOPIC EXAMINATION
Bacteria, UA: NONE SEEN
Epithelial Cells (non renal): NONE SEEN /HPF (ref 0–10)
RBC, Urine: NONE SEEN /HPF (ref 0–2)
Renal Epithel, UA: NONE SEEN /HPF
WBC, UA: NONE SEEN /HPF (ref 0–5)

## 2021-11-30 LAB — URINALYSIS, ROUTINE W REFLEX MICROSCOPIC
Bilirubin, UA: NEGATIVE
Glucose, UA: NEGATIVE
Ketones, UA: NEGATIVE
Leukocytes,UA: NEGATIVE
Nitrite, UA: NEGATIVE
RBC, UA: NEGATIVE
Specific Gravity, UA: 1.02 (ref 1.005–1.030)
Urobilinogen, Ur: 0.2 mg/dL (ref 0.2–1.0)
pH, UA: 5 (ref 5.0–7.5)

## 2021-11-30 MED ORDER — CIPROFLOXACIN HCL 500 MG PO TABS
500.0000 mg | ORAL_TABLET | Freq: Once | ORAL | Status: AC
  Administered 2021-11-30: 500 mg via ORAL

## 2021-11-30 NOTE — Progress Notes (Signed)
Subjective: 1. History of bladder cancer   2. Prostate cancer ([place])   3. Low testosterone   4. Urge incontinence   5. Nocturia      11/30/21: Mr. Serio returns today in f/u for cystoscopy for surveillance for his history of bladder cancer and for f/u of his history of prostate cancer.  His last PSA was <0.1 with a testosterone of 39 on 06/29/21.  He didn't get labs prior to this visit. He remains on tamsulosin and Myrbetriq '25mg'$  (he was on 50 mg but couldn't afford it so he is getting it from a friend) for his voiding symptoms.   He has had no hematuria.  He has nocturia x 2.  He has no incontinence.  He has a reduced stream.  He feels he empties.  He has had no pain.  He has some left side pain over the past month.  He has had no hot flashes.  He has been trying the viagra but doesn't have much response.   06/29/21: Mr. Lowe returns today in f/u to assess his response to Myrbetriq.  He has had a benefit of the '50mg'$  dose with reduced frequency and reduced incontinence.  He has nocturia x 4-5 but doesn't wet the bed.   He has  no hematuria or dysuria.   He didn't get labs in February and will need those today.  He remains on tamsulosin and sildenafil.  06/01/21: Mr. Ahlers return today in f/u for the history below.  He cancelled his last appoint because of COVID and is overdue for f/u.  His PSA was <0.1 in 10/22 and his T was < 3.  He got his last Firmagon on 02/16/21.  He was treated for a UTI about 2 weeks ago.  He was having dysuria that has resolved.  He has no hematuria.   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   He remains on tamsulosin 0.'8mg'$  daily and has sildenafil for ED but it is not working too well.   06/16/20: Mr. Dugar returns today in f/u.  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  He completed radiation therapy on 06/13/20.   He has no hematuria.  He is voiding ok with an IPSS of 19.  He has nocturia x 2  and an occasional sensation of incomplete emptying.  He has some intermittency.  He has some urgency with occasional UUI.   UA is clear today.  He remains on tamsulosin but is up to bid.   03/04/20: Mr. Meyn return today follow a TURBT.  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  He is doing well without hematuria.  His UA is unremarkable.  He has an IPSS of 14.  He has a good stream but has some nocturia.  He can have some hesitancy.   He got his initial firmagon on 02/18/20.   He had raised knots on his abdomen.  He is to get marked for EXRT on [facility]/20 at [facility].    02/05/20: Mr. Neyland returns today in f/u to discuss his recent prostate biopsy on 01/22/20.   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  4/12 cores had Gleason 8 and a total of 9/12 cores are involved with prostate cancer.   He had a negative bone scan on 02/02/20 and a CT hematuria study on  01/19/20 showed no mets.   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  His prostate volume is 70m. He has 3-10 RBC's on UA today and needs cystoscopy.    GU Hx: Mr. MLipseyis a 76yo male who is sent in consultation by Dr. CJonny Ruizfor an elevated PSA with a further rise on a repeat.  I am unable to get the levels as they were not in the referral documents and we are unable to reach anyone in the referring office.  The patient is unaware of the levels.   He has had some issues with voiding and is on tamsulosin which has been helping.  He has nocturia x 1-2.  He had frequency but that has improved on tamsulosin.  He has urgency and has had UUI but that has improved.  He has a variable stream.  He feels like he doesn't empty completely.  He has no dysuria or hematuria.  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   He has had no UTI's but he may have had stones in the past but no GU surgery.  He has some issues with ED and has been given sildenafil.  He is a former smoker but none in 12  years.        ROS:  ROS  Allergies  Allergen Reactions   Penicillins Hives    Reaction: Childhood   Atorvastatin Other (See Comments)    Stomach burn   Omeprazole Other (See Comments)    unknown   Shellfish Allergy Swelling    Numb face   Tramadol Other (See Comments)    unknown   Zoloft [Sertraline] Other (See Comments)    unknown    Past Medical History:  Diagnosis Date   Anxiety    CAD (coronary artery disease)    pt reports stent placed ~2011   Cancer ([facility]    Prostate, bladder   COPD (chronic obstructive pulmonary disease) (HCC)    CVA (cerebral vascular accident) ([place]   He has had 5 strokes.    Erectile dysfunction due to arterial insufficiency    GERD (gastroesophageal reflux disease)    Hypertension     Past Surgical History:  Procedure Laterality Date   CARDIAC CATHETERIZATION  2011   with stent   CHOLECYSTECTOMY  4-5 yrs ago   CYSTOSCOPY  01/29/2020   bladder   TRANSURETHRAL RESECTION OF BLADDER TUMOR WITH MITOMYCIN-C N/A 02/25/2020   Procedure: TRANSURETHRAL RESECTION OF BLADDER TUMOR WITH GEMCITABINE;  Surgeon: WIrine Seal MD;  Location: [facility];  Service: Urology;  Laterality: N/A;    Social History   Socioeconomic History   Marital status: Married    Spouse name: Not on file   Number of children: Not on file   Years of education: Not on file   Highest education level: Not on file  Occupational History   Not on file  Tobacco Use   Smoking status: Former   Smokeless tobacco: Never  Vaping Use   Vaping Use: Never used  Substance and Sexual Activity   Alcohol use: Yes    Comment: occasional   Drug use: Not Currently   Sexual activity: Yes  Other Topics Concern   Not on file  Social History Narrative   Not on file   Social Determinants of Health   Financial Resource Strain: Not on file  Food Insecurity: Not on file  Transportation Needs: Not on file  Physical Activity: Not on file  Stress:  Not on file  Social  Connections: Not on file  Intimate Partner Violence: Not on file    Family History  Problem Relation Age of Onset   Alzheimer's disease Mother    Heart attack Father     Anti-infectives: Anti-infectives (From admission, onward)    Start     Dose/Rate Route Frequency Ordered Stop   11/30/21 0945  CIPROFLOXACIN HCL 500 MG PO TABS        500 mg Oral  Once 11/30/21 0939 11/30/21 0950       Current Outpatient Medications  Medication Sig Dispense Refill   amLODipine (NORVASC) 10 MG tablet Take by mouth.     clopidogrel (PLAVIX) 75 MG tablet Take 75 mg by mouth daily.     Docusate Sodium (DSS) 100 MG CAPS Take by mouth.     gabapentin (NEURONTIN) 300 MG capsule Take 300 mg by mouth 3 (three) times daily.     HYDROcodone-acetaminophen (NORCO/VICODIN) 5-325 MG tablet Take 1 tablet by mouth 2 (two) times daily as needed for pain.     lidocaine (LIDODERM) 5 % Place 1 patch onto the skin daily as needed (Pain). Remove & Discard patch within 12 hours or as directed by MD     methocarbamol (ROBAXIN) 500 MG tablet Take 500 mg by mouth 2 (two) times daily as needed.     mirabegron ER (MYRBETRIQ) 50 MG TB24 tablet Take 1 tablet (50 mg total) by mouth daily. 30 tablet 11   Multiple Vitamin (MULTIVITAMIN) tablet Take 1 tablet by mouth daily.     omega-3 acid ethyl esters (LOVAZA) 1 g capsule Take by mouth daily.     pantoprazole (PROTONIX) 20 MG tablet Take 20 mg by mouth daily.      sildenafil (VIAGRA) 100 MG tablet Take 100 mg by mouth daily as needed for erectile dysfunction.     tamsulosin (FLOMAX) 0.4 MG CAPS capsule TAKE 2 CAPSULES(0.8 MG) BY MOUTH DAILY 180 capsule 3   triamcinolone cream (KENALOG) 0.5 % Apply 1 application topically daily as needed (cut/rash).      No current facility-administered medications for this visit.     Objective: Vital signs in last 24 hours: BP 124/70   Pulse 79   Intake/Output from previous day: No intake/output data recorded. Intake/Output this  shift: '@IOTHISSHIFT'$ @   Physical Exam  Lab Results:  Results for orders placed or performed in visit on 11/30/21 (from the past 24 hour(s))  PSA     Status: None   Collection Time: 11/30/21  9:50 AM  Result Value Ref Range   Prostate Specific Ag, Serum <0.1 0.0 - 4.0 ng/mL   Narrative   Performed at:  [address] [place], [place]  [id number] Lab Director: Rush Farmer MD, Phone:  [phone]  Testosterone     Status: None   Collection Time: 11/30/21  9:50 AM  Result Value Ref Range   Testosterone 271 264 - 916 ng/dL   Narrative   Performed at:  [facility] [address] [place], [place]  [id number] Lab Director: Rush Farmer MD, Phone:  [phone]  Urinalysis, Routine w reflex microscopic     Status: Abnormal   Collection Time: 11/30/21 10:38 AM  Result Value Ref Range   Specific Gravity, UA 1.020 1.005 - 1.030   pH, UA 5.0 5.0 - 7.5   Color, UA Yellow Yellow   Appearance Ur Clear Clear   Leukocytes,UA Negative Negative   Protein,UA 3+ (A) Negative/Trace  Glucose, UA Negative Negative   Ketones, UA Negative Negative   RBC, UA Negative Negative   Bilirubin, UA Negative Negative   Urobilinogen, Ur 0.2 0.2 - 1.0 mg/dL   Nitrite, UA Negative Negative   Microscopic Examination See below:    Narrative   Performed at:  [facility] [address], [place], [place]  [id number] Lab Director: Mina Marble MT, Phone:  [phone]  Microscopic Examination     Status: None   Collection Time: 11/30/21 10:38 AM   Urine  Result Value Ref Range   WBC, UA None seen 0 - 5 /hpf   RBC, Urine None seen 0 - 2 /hpf   Epithelial Cells (non renal) None seen 0 - 10 /hpf   Renal Epithel, UA None seen None seen /hpf   Mucus, UA Present Not Estab.   Bacteria, UA None seen None seen/Few   Narrative   Performed at:  [facility] [address], [place], [place]  [id number] Lab Director: Barrett, Phone:   [phone]      BMET No results for input(s): "NA", "K", "CL", "CO2", "GLUCOSE", "BUN", "CREATININE", "CALCIUM" in the last 72 hours. PT/INR No results for input(s): "LABPROT", "INR" in the last 72 hours. ABG No results for input(s): "PHART", "HCO3" in the last 72 hours.  Invalid input(s): "PCO2", "PO2"  Studies/Results:  Procedure: Cystoscopy.  He was prepped with betadine and 2% lidocaine jelly and then given Cipro '500mg'$  po.  The urethra was normal.  The prostate had bilobar hyperplasia with coaptation. There was radiation neovascularity at the bladder neck.  There was mild trabecluation.  No tumors were noted.   The UO's were normal.   Assessment/Plan: Prostate cancer.  His PSA and testosterone were low in 3/23 and he didn't get labs prior ot this visit as ordered but will have them today and then in 6 months   He received only 12 of 18 months of ADT.     History of Bladder cancer.  He had LG NMIBC without recurrence.  No hematuria.  Cystoscopy in one year.   BPH with BOO.  He will continue  tamsulosin 2 daily.   Radiation cystitis with OAB wet.   He responded to Myrbetriq so I have given him additional '25mg'$  samples.   ED.  He continues Sildenafil but has a poor response. .    Meds ordered this encounter  Medications   ciprofloxacin (CIPRO) tablet 500 mg     Orders Placed This Encounter  Procedures   Microscopic Examination   Urinalysis, Routine w reflex microscopic   PSA   Testosterone   PSA    Standing Status:   Future    Standing Expiration Date:   12/01/2022   Testosterone    Standing Status:   Future    Standing Expiration Date:   12/01/2022     Return in about 6 months (around 06/02/2022) for with labs.    CC: Dr. Jonny Ruiz.     Irine Seal 12/01/2021 [phone] ID: Isaac Bliss, male   DOB: 1945/10/21, 77 y.o.   MRN: [id number]

## 2021-12-01 ENCOUNTER — Telehealth: Payer: Self-pay

## 2021-12-01 LAB — PSA: Prostate Specific Ag, Serum: <0.1 ng/mL (ref 0.0–4.0)

## 2021-12-01 LAB — CYTOLOGY, URINE

## 2021-12-01 LAB — TESTOSTERONE: Testosterone: 271 ng/dL (ref 264–916)

## 2021-12-01 NOTE — Telephone Encounter (Signed)
Made patient aware and patient voiced understanding.

## 2021-12-01 NOTE — Telephone Encounter (Signed)
-----   Message from Irine Seal, MD sent at 12/01/2021  9:55 AM EDT ----- His PSA remains undetectible with a normalization of the testosterone level.  F/u as planned.  ----- Message ----- From: Sherrilyn Rist, CMA Sent: 12/01/2021   8:10 AM EDT To: Irine Seal, MD  Please Review

## 2021-12-01 NOTE — Telephone Encounter (Signed)
Tried calling patient with no answer. Left message for patient to call our office back. Will followed up at a later time.

## 2021-12-01 NOTE — Telephone Encounter (Signed)
Made patient aware and patient voiced understanding

## 2021-12-05 NOTE — Progress Notes (Signed)
Letter sent.

## 2021-12-14 ENCOUNTER — Encounter: Payer: Self-pay | Admitting: Surgical

## 2021-12-14 ENCOUNTER — Ambulatory Visit (INDEPENDENT_AMBULATORY_CARE_PROVIDER_SITE_OTHER): Payer: Medicare Other | Admitting: Surgical

## 2021-12-14 VITALS — BP 141/70 | HR 78 | Ht 72.0 in | Wt 217.0 lb

## 2021-12-14 DIAGNOSIS — C44311 Basal cell carcinoma of skin of nose: Secondary | ICD-10-CM

## 2021-12-14 NOTE — Progress Notes (Signed)
Patient ID: Barry Alvarado, male    DOB: 02/04/1946, 76 y.o.   MRN: [id number]  Chief Complaint  Patient presents with   Pre-op Exam      ICD-10-CM   1. 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  male  with a history of basal cell carcinoma.  He presents for preoperative evaluation for upcoming procedure, Mohs reconstruction of left nose with possible split-thickness skin graft, adjacent tissue transfer, application of skin substitute, scheduled for 12/28/2021 with Dr. Erin Hearing. Patient presents today family.  The patient has not had problems with anesthesia. No history of DVT/PE.  No family history of DVT/PE.  No family or personal history of bleeding or clotting disorders.    Patient is currently on Plavix.  He does have a history of multiple CVAs, also has a history of cardiac stents.  He denies any cardiac or pulmonary symptoms today.  He reports his Plavix is prescribed by the [place].  Summary of Previous Visit: Patient is scheduled for Mohs excision of his left nasal basal cell carcinoma on 12/27/2021 in [place].  He is on Plavix for history of strokes but stated that he cannot be taken off of this.  PMH Significant for: COPD, CVA x5, coronary artery disease with stent placement in 2011, GERD, hypertension, chronic kidney disease with proteinuria, vitamin D deficiency, on chronic Norco 5 twice daily for joint pain and back pain.  History of prostate and bladder cancer with subsequent radiation.  Patient reports she has stopped Plavix in the past for various procedures including colonoscopies,   Past Medical History: Allergies: Allergies  Allergen Reactions   Penicillins Hives    Reaction: Childhood   Atorvastatin Other (See Comments)    Stomach burn   Omeprazole Other (See Comments)    unknown   Shellfish Allergy Swelling    Numb face   Tramadol Other (See Comments)    unknown    Zoloft [Sertraline] Other (See Comments)    unknown    Current Medications:  Current Outpatient Medications:    amLODipine (NORVASC) 10 MG tablet, Take by mouth., Disp: , Rfl:    clopidogrel (PLAVIX) 75 MG tablet, Take 75 mg by mouth daily., Disp: , Rfl:    Docusate Sodium (DSS) 100 MG CAPS, Take by mouth., Disp: , Rfl:    gabapentin (NEURONTIN) 300 MG capsule, Take 300 mg by mouth 3 (three) times daily., Disp: , Rfl:    HYDROcodone-acetaminophen (NORCO/VICODIN) 5-325 MG tablet, Take 1 tablet by mouth 2 (two) times daily as needed for pain., Disp: , Rfl:    lidocaine (LIDODERM) 5 %, Place 1 patch onto the skin daily as needed (Pain). Remove & Discard patch within 12 hours or as directed by MD, Disp: , Rfl:    methocarbamol (ROBAXIN) 500 MG tablet, Take 500 mg by mouth 2 (two) times daily as needed., Disp: , Rfl:    mirabegron ER (MYRBETRIQ) 50 MG TB24 tablet, Take 1 tablet (50 mg total) by mouth daily., Disp: 30 tablet, Rfl: 11   Multiple Vitamin (MULTIVITAMIN) tablet, Take 1 tablet by mouth daily., Disp: , Rfl:    omega-3 acid ethyl esters (LOVAZA) 1 g capsule, Take by mouth daily., Disp: , Rfl:    pantoprazole (PROTONIX) 20 MG tablet, Take 20 mg by mouth daily. , Disp: , Rfl:    sildenafil (VIAGRA) 100 MG tablet, Take  100 mg by mouth daily as needed for erectile dysfunction., Disp: , Rfl:    tamsulosin (FLOMAX) 0.4 MG CAPS capsule, TAKE 2 CAPSULES(0.8 MG) BY MOUTH DAILY, Disp: 180 capsule, Rfl: 3   triamcinolone cream (KENALOG) 0.5 %, Apply 1 application topically daily as needed (cut/rash). , Disp: , Rfl:   Past Medical Problems: Past Medical History:  Diagnosis Date   Anxiety    CAD (coronary artery disease)    pt reports stent placed ~2011   Cancer [facility]    Prostate, bladder   COPD (chronic obstructive pulmonary disease) ([place])    CVA (cerebral vascular accident) ([place]) 2017   He has had 5 strokes.    Erectile dysfunction due to arterial insufficiency    GERD (gastroesophageal  reflux disease)    Hypertension     Past Surgical History: Past Surgical History:  Procedure Laterality Date   CARDIAC CATHETERIZATION  2011   with stent   CHOLECYSTECTOMY  4-5 yrs ago   CYSTOSCOPY  01/29/2020   bladder   TRANSURETHRAL RESECTION OF BLADDER TUMOR WITH MITOMYCIN-C N/A 02/25/2020   Procedure: TRANSURETHRAL RESECTION OF BLADDER TUMOR WITH GEMCITABINE;  Surgeon: Irine Seal, MD;  Location: [facility];  Service: Urology;  Laterality: N/A;    Social History: Social History   Socioeconomic History   Marital status: Married    Spouse name: Not on file   Number of children: Not on file   Years of education: Not on file   Highest education level: Not on file  Occupational History   Not on file  Tobacco Use   Smoking status: Former   Smokeless tobacco: Never  Vaping Use   Vaping Use: Never used  Substance and Sexual Activity   Alcohol use: Yes    Comment: occasional   Drug use: Not Currently   Sexual activity: Yes  Other Topics Concern   Not on file  Social History Narrative   Not on file   Social Determinants of Health   Financial Resource Strain: Not on file  Food Insecurity: Not on file  Transportation Needs: Not on file  Physical Activity: Not on file  Stress: Not on file  Social Connections: Not on file  Intimate Partner Violence: Not on file    Family History: Family History  Problem Relation Age of Onset   Alzheimer's disease Mother    Heart attack Father     Review of Systems: Review of Systems  Constitutional: Negative.   Respiratory:  Positive for shortness of breath (Chronic, no changes). Negative for cough.   Cardiovascular:  Negative for chest pain and palpitations.  Gastrointestinal: Negative.   Neurological: Negative.     Physical Exam: Vital Signs BP (!) 141/70 (BP Location: Right Arm, Patient Position: Sitting, Cuff Size: Large)   Pulse 78   Ht 6' (1.829 m)   Wt 217 lb (98.4 kg)   SpO2 97%   BMI 29.43 kg/m   Physical Exam   Constitutional:      General: Not in acute distress.    Appearance: Normal appearance. Not ill-appearing.  HENT:     Head: Normocephalic and atraumatic.  Eyes:     Pupils: Pupils are equal, round Neck:     Musculoskeletal: Normal range of motion.  Cardiovascular:     Rate and Rhythm: Normal rate    Pulses: Normal pulses.  Pulmonary:     Effort: Pulmonary effort is normal. No respiratory distress.  Abdominal:     General: Abdomen is flat. There is no distension.  Musculoskeletal: Normal range of motion.  Skin:    General: Skin is warm and dry.     Findings: No erythema or rash.  Neurological:     General: No focal deficit present.     Mental Status: Alert and oriented to person, place, and time. Mental status is at baseline.     Motor: No weakness.  Psychiatric:        Mood and Affect: Mood normal.        Behavior: Behavior normal.    Assessment/Plan: The patient is scheduled for nasal reconstruction with flaps and grafts as necessary with Dr. Erin Hearing.  Risks, benefits, and alternatives of procedure discussed, questions answered and consent obtained.    Smoking Status: Quit smoking 12 years ago; Counseling Given?  N/A  Caprini Score: 8, high; Risk Factors include: Age, COPD, history of cancer, BMI greater than 25, and length of planned surgery. Recommendation for mechanical and chemoprophylaxis. Encourage early ambulation.  We will have patient restart his Plavix shortly after surgery, if held at all pending prescriber recommendations.  Pictures obtained: '@consult'$   Post-op Rx sent to pharmacy:  Patient is on chronic hydrocodone, will await recommendations by pain management provider.  He currently takes Norco 5 twice daily, will likely prescribe 1 additional Norco for him to take for a total of 3 times daily  Patient was provided with the General Surgical Risk consent document and Pain Medication Agreement prior to their appointment.  They had adequate time to read through  the risk consent documents and Pain Medication Agreement. We also discussed them in person together during this preop appointment. All of their questions were answered to their satisfaction.  Recommended calling if they have any further questions.  Risk consent form and Pain Medication Agreement to be scanned into patient's chart.  We discussed the surgical plan is depending on the size of patient's defect after Mohs excision.  We discussed the possibility of nasolabial flap, forehead flap, application of skin graft, application of skin substitute.  We discussed expected recovery for all these procedures.  All of his questions and his family's questions were sure to their content.  I discussed with him that we would send preoperative clearance to his PCP, who is also his pain management provider and prescribes him his Norco 5 twice daily for chronic joint pain.  We will also request the option to hold Plavix if medically safe for patient due to his history of CVAs.   Electronically signed by: Carola Rhine Imani Fiebelkorn, PA-C 12/14/2021 12:47 PM

## 2021-12-15 ENCOUNTER — Encounter: Payer: Medicare Other | Admitting: Physician Assistant

## 2021-12-18 ENCOUNTER — Telehealth: Payer: Self-pay

## 2021-12-18 NOTE — Telephone Encounter (Signed)
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  ----- Message ----- From: Sherrilyn Rist, CMA Sent: 12/01/2021   8:10 AM EDT To: Irine Seal, MD  Please Review

## 2021-12-18 NOTE — Telephone Encounter (Signed)
Faxed surgical clearance to Yves Dill, NP for PO pain management

## 2021-12-18 NOTE — Telephone Encounter (Signed)
Faxed surgical clearance for PO pain management to Yves Dill, NP

## 2021-12-18 NOTE — Telephone Encounter (Signed)
Letter sent out to patient.

## 2021-12-20 ENCOUNTER — Telehealth: Payer: Self-pay

## 2021-12-20 NOTE — Telephone Encounter (Signed)
Legrand Pitts, RN from [facility] called to inquire about why surgical clearance was needed. She's asking for a call back at [phone], ext [phone]

## 2021-12-21 ENCOUNTER — Telehealth: Payer: Self-pay | Admitting: Surgical

## 2021-12-21 ENCOUNTER — Telehealth: Payer: Self-pay

## 2021-12-21 ENCOUNTER — Encounter: Payer: Self-pay | Admitting: Surgical

## 2021-12-21 NOTE — Telephone Encounter (Signed)
Returned Jennifers's call. LMVM to call back.

## 2021-12-21 NOTE — Telephone Encounter (Signed)
[name] returned call with patient, they state Barry Alvarado doesn't see either one that his PCP is the one to follow up him for his chronic issues.

## 2021-12-21 NOTE — Telephone Encounter (Signed)
Called and spoke with patient Barry Alvarado and his wife Barry Alvarado, we discussed that patient's PCP did not clear him for surgery and recommended cardiology and pulmonology follow-up for clearance.  Patient and his wife confirmed he does not have a cardiologist or pulmonologist.  He reports that he only goes to the [place] other than Catalina [facility], NP.  We are currently reaching out to the [place] for guidance on Plavix.  We did discuss that it is possible that his surgery may need to be postponed until we can obtain clearance.  We did also discuss the option for minor tissue rearrangement or full-thickness skin graft or other reconstructive options under local anesthesia.  We did discuss that this would involve injecting a numbing medication into the surgical site to help with pain control, but that he would not be provided any anesthesia or put to sleep.  Client felt that he would be able to tolerate this.  I will further discuss with Dr. Erin Hearing and discussed with him and his wife that we would call them back.

## 2021-12-21 NOTE — Progress Notes (Signed)
Surgical Clearance has been received from [facility], NP for patient's upcoming surgery with Dr. Erin Hearing.  Patient's PCP states that patient is not cleared for surgery, recommends cardiology and pulmonary evaluation.  We will also need clearance from his oncologist.  Of note patient is also on chronic narcotics prescribed by PCP, no specific recommendations were provided.  Will call patient and see if he sees pulmonology, cardiology.

## 2021-12-21 NOTE — Telephone Encounter (Signed)
Spoke with [place] with [place], [place]. Was advised effective 08/2021, patient has to have our NPI on request form to authorize surgeries. She will call back and confirm if he is authorized as well as directions regarding Plavix

## 2021-12-21 NOTE — Telephone Encounter (Signed)
I called and spoke with them.  Documented separately.

## 2021-12-21 NOTE — Telephone Encounter (Signed)
Called patient to ask if he saw cardiology or pulmonology.  Patient did not answer.  I left a HIPAA compliant voicemail.

## 2021-12-26 ENCOUNTER — Telehealth: Payer: Self-pay | Admitting: Plastic Surgery

## 2021-12-26 NOTE — Telephone Encounter (Signed)
Danise Mina from [facility] called to ask if patient needs to be off Plavix prior to his SX. Jenn requested to have a call back.

## 2021-12-27 ENCOUNTER — Encounter (HOSPITAL_COMMUNITY): Payer: Self-pay | Admitting: Plastic Surgery

## 2021-12-27 ENCOUNTER — Encounter (HOSPITAL_COMMUNITY): Payer: Self-pay | Admitting: Vascular Surgery

## 2021-12-27 ENCOUNTER — Telehealth: Payer: Self-pay

## 2021-12-27 ENCOUNTER — Telehealth: Payer: Self-pay | Admitting: *Deleted

## 2021-12-27 NOTE — Progress Notes (Signed)
Called to speak to patient about surgery on 12/28/21 with Dr. Erin Hearing.  Patient informed me that he was going to reschedule his surgery.  He stated he had been taking his Plavix.  Took Plavix today. Will call Dr. Erin Hearing office and our surgical scheduler, Baker Janus.   Left message with surgical scheduler at Dr. Erin Hearing office with patient's name and DOB.  I let them know the patient was not coming for surgery on 12/28/21 and stated he was going to reschedule.

## 2021-12-27 NOTE — Progress Notes (Addendum)
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 By notes, he was scheduled for MOHS on 12/27/21 in [place] with plans for subsequent reconstruction. He is on Plavix for multiple CVAs. He indicated to Dr. Luppens that he was advised to not come off Plaivx---although later indicated that it had been stopped temporarily for other procedures. Dr. Luppens documented that he discussed bleeding risks with the patient should he have to remain on Plavix for surgery.     History includes former smoker, HTN, CKD (CKD stage II), CAD (BMS mid LCX 08/16/09), COPD, CVA (x 5e), SDH, bladder cancer (s/p TURBT 02/25/20), prostate cancer (completed radiation therapy 06/13/20), skin cancer (BCC), GERD, HLD, cholecystectomy. Former alcohol abuse per [facility] records.  Clearance status is pending per Scheeler, Matthew, PA-C 12/21/21 notes. Patient indicated that surgery was going to be rescheduled. Unclear is this was because he had not stopped Plavix or if it was because clearance(s) were pending. On 12/27/21, I sent a staff message to Matthew and Dr. Luppens for update.  There is a [facility] Interventional Cardiologist notation by Peter Danyi, MD from 12/26/21 that is specific to perioperative Plavix instructions only. (Patient denied seeing a cardiologist). It simply states,  "Consult received for stopping Plavix prior to MOHS surgery.  Surgeon should be asked if Plavix needs to be stopped for this surgery.  If it does need to be stopped, the  reason of Plavix is important.  If patient had a PCI in the last 3-6 months with DES, Plavix should not be  stopped without other coverage. Surgeon should be consulted for possibility of  delaying surgery. If patient had a PEI in the last 3 months, Plavix probably should not be  stopped. In [facility] there is no record of these.  If Plavix is given for stroke, neurology should be asked about stopping Plavix.  Usually, Plavix is stopped 5 days prior to surgery. It can be restarted, if needed, once the surgeon deems it safe to restart."  Additional input pending follow-up from surgeon.   VS: Per [facility] VS on 02/28/21: BP 131/66, HR 85, O2 sat 97%, BMI 30    PROVIDERS: Nsumanganyi, [place] Cesar, NP is PCP ([facility]) Morris, David, MD is RAD-ONC ([facility]) Parikh, Amar, MD is nephrologist ([facility]) Wrenn, John, MD is urologist   LABS: For day of surgery as indicated. Per 10/26/21 labs in [facility], AST 31, ALT 34, Alk Ph 102, glucose 96, K 4.6, Ca 8.8, BUN 21, Cr 1.18, Na 142, eGFR 65, H/H 14.2/44.0, PLT 252    IMAGES: CXR 10/28/20 ([facility]): FINDINGS: Supportive devices: None.  Cardiovascular/lungs/pleura: Cardiomediastinal silhouette is within normal limits. Thoracic aortic calcifications. No focal airspace consolidation. No effusion or pneumothorax.  Other: No acute displaced fractures. Imaged upper abdomen is within normal limits. Carotid artery calcifications. IMPRESSION: No evidence of acute cardiopulmonary disease.  MRI Brain 7/7/722 ([facility]): IMPRESSION: - Thin acute subdural   hematoma measuring 2 mm in greatest thickness  overlying the right cerebral hemisphere, unchanged in size as  compared to the head CT of 10/26/2020. No associated mass effect.  - Small chronic cortical infarcts within the posterior right frontal  lobe and bilateral parietal lobes.  - Advanced chronic small vessel ischemic disease with multiple chronic  lacunar infarcts, as  detailed.  - Moderate generalized cerebral atrophy. Comparatively mild cerebellar  atrophy.  - Paranasal sinus disease, as described.  - Right greater than left mastoid effusions.    CTA Head/Neck 05/21/16: IMPRESSION:  1.  No acute vascular abnormality.  2.  No large vessel cut off.  3.  Mild stenosis of the left internal carotid artery.     EKG: Currently, no EKG tracing within the past year is available for review. Per Result Narrative in CE, 10/26/20 EKG showed NSR.   CV: US Carotid 05/22/16 ([facility]): IMPRESSION:  1.  No hemodynamically significant stenosis on either side.    2.  Both vertebral arteries are patent with antegrade flow.    Echo 05/22/16 ([facility]): Interpretation Summary  A complete portable two-dimensional transthoracic echocardiogram with color  flow Doppler and Spectral Doppler was performed. Saline contrast injection  was performed. The left ventricle is grossly normal size.  There is normal left ventricular wall thickness.  The left ventricular ejection fraction is normal (60-65%).  The left ventricular wall motion is normal.  Injection of contrast documented no interatrial shunt .  Mild aortic sclerosis is present with good valvular opening.  The right ventricle is mildly dilated.  The right ventricular ejection fraction is grossly normal.  Grade I mild diastolic dysfunction; abnormal relaxation pattern.   Cardiac cath for elective scheduled PCI 08/16/09 ([facility], Dr. Joeseph Amor at request of Dr. Ninfa Meeker with [facility] in [place], [place]): DIAGNOSTIC SUMMARY  Coronary Artery Disease      Left Main:  insignificant      LAD system:  not evaluated      LCX system:  total      RCA system:  not evaluated  INTERVENTIONAL SUMMARY     Lesions Attempted:  1     Lesion #1:   Mid LCX 80%  (pre) to 10%  (post). Stent: 2.5 x 18        Integrity BMS to 12  atms.     An occluded OM1 was probed with Ramond Marrow and Confiaza Pro 12      and supported with  Fine cross, but unable to enter the true distal     lumen of the OM1      Past Medical History:  Diagnosis Date   Anxiety    CAD (coronary artery disease)    BMS to mid LCX 08/16/09   Cancer [facility])    Prostate, bladder   COPD (chronic obstructive pulmonary disease) ([place])    CVA (cerebral vascular accident) ([place]) 2017   He has had 5 strokes.    Erectile dysfunction due to arterial insufficiency    GERD (gastroesophageal reflux disease)    Hypertension     Past Surgical History:  Procedure Laterality Date   CARDIAC CATHETERIZATION  2011   with stent   CHOLECYSTECTOMY  4-5 yrs ago   CYSTOSCOPY  01/29/2020   bladder   TRANSURETHRAL RESECTION OF BLADDER TUMOR WITH MITOMYCIN-C N/A 02/25/2020   Procedure: TRANSURETHRAL RESECTION OF BLADDER TUMOR WITH GEMCITABINE;  Surgeon: Irine Seal, MD;  Location: [facility];  Service: Urology;  Laterality: N/A;  MEDICATIONS: No current facility-administered medications for this encounter.    acetaminophen (TYLENOL) 500 MG tablet   albuterol (VENTOLIN HFA) 108 (90 Base) MCG/ACT inhaler   amLODipine (NORVASC) 10 MG tablet   ascorbic acid (VITAMIN C) 500 MG tablet   Carboxymethylcell-Hypromellose (GENTEAL OP)   clopidogrel (PLAVIX) 75 MG tablet   colchicine 0.6 MG tablet   cyanocobalamin (VITAMIN B12) 1000 MCG tablet   diclofenac Sodium (VOLTAREN) 1 % GEL   escitalopram (LEXAPRO) 5 MG tablet   fluticasone-salmeterol (WIXELA INHUB) 500-50 MCG/ACT AEPB   gabapentin (NEURONTIN) 300 MG capsule   HYDROcodone-acetaminophen (NORCO/VICODIN) 5-325 MG tablet   ipratropium-albuterol (DUONEB) 0.5-2.5 (3) MG/3ML SOLN   lipase/protease/amylase (CREON) 36000 UNITS CPEP capsule   methocarbamol (ROBAXIN) 500 MG tablet   mirabegron ER (MYRBETRIQ) 25 MG TB24 tablet   Multiple Vitamin (MULTIVITAMIN) tablet   pantoprazole (PROTONIX) 20 MG tablet   rosuvastatin (CRESTOR) 20 MG tablet   sildenafil (VIAGRA) 100 MG tablet   simethicone (MYLICON) 80 MG chewable  tablet   sodium bicarbonate 650 MG tablet   tamsulosin (FLOMAX) 0.4 MG CAPS capsule   Tiotropium Bromide Monohydrate (SPIRIVA RESPIMAT) 2.5 MCG/ACT AERS   Vitamin D, Ergocalciferol, (DRISDOL) 1.25 MG (50000 UNIT) CAPS capsule   mirabegron ER (MYRBETRIQ) 50 MG TB24 tablet    Myra Gianotti, PA-C Surgical Short Stay/Anesthesiology [facility] Phone [phone] [facility] Phone [phone] 12/27/2021 10:10 AM

## 2021-12-27 NOTE — Telephone Encounter (Signed)
Called patient to find out who prescribes his pain meds, and Plavix. Patient was asleep and spoke with daughter. She will have him call back once he wakes up.

## 2021-12-27 NOTE — Telephone Encounter (Signed)
Received teams message that patient called in at 4:10p on 9/5 to cancel surgery for 9/7, did not stop plavix

## 2021-12-28 ENCOUNTER — Ambulatory Visit (HOSPITAL_COMMUNITY): Admission: RE | Admit: 2021-12-28 | Payer: Medicare Other | Source: Home / Self Care | Admitting: Plastic Surgery

## 2021-12-28 SURGERY — RECONSTRUCTION, NOSE
Anesthesia: General | Site: Nose | Laterality: Left

## 2022-01-05 ENCOUNTER — Encounter: Payer: Medicare Other | Admitting: Plastic Surgery

## 2022-01-05 ENCOUNTER — Ambulatory Visit (INDEPENDENT_AMBULATORY_CARE_PROVIDER_SITE_OTHER): Payer: No Typology Code available for payment source | Admitting: Surgical

## 2022-01-05 DIAGNOSIS — C44311 Basal cell carcinoma of skin of nose: Secondary | ICD-10-CM

## 2022-01-05 NOTE — Progress Notes (Signed)
   Referring Provider Nsumanganyi, Ferdinand Lango, NP [address] [place],  [place] [address]   CC:  Chief Complaint  Patient presents with   Follow-up      Barry Alvarado is an 76 y.o. male.  HPI: Patient is a 76 year old male here for follow-up in regards to basal cell carcinoma of the left side of his nose.  He was initially scheduled for Mohs procedure and Reconstruction of the Mohs defect in the operating room on 12/28/2021, however when clearances were sent to patient's PCP/pain management provider, it was noted that he had to be seen by cardiology, pulmonology and neurology prior to surgery.  After discussing this with the patient he reports that he does not have any of these specialists that manage him.  He does however go to the [place].  There has been some confusion about who prescribes his Plavix and for what reasons, it seems to be that it is associated with his history of 5 strokes and the [place] prescribes the medication, but he does not see neurology.  He presented today for what was supposed to be a postsurgery follow-up.  Patient reports he is doing well.  He reports he verbally received instructions from the [place] that he can hold his Plavix.  Review of Systems General: No fevers or chills  Physical Exam    12/14/2021   10:58 AM 11/30/2021    9:37 AM 11/17/2021    2:25 PM  Vitals with BMI  Height '6\' 0"'$   '6\' 1"'$   Weight 217 lbs  215 lbs 10 oz  BMI 35.00  93.81  Systolic 829 937 169  Diastolic 70 70 64  Pulse 78 79 94    General:  No acute distress,  Alert and oriented, Non-Toxic, Normal speech and affect    Assessment/Plan 76 year old male with basal cell carcinomas left nose  -He has been unable to have his Mohs surgery due to not obtaining clearances.  He is currently on Plavix for history of 5 strokes, he does not see neurology and therefore we have not been able to obtain clearances.  PCP recommended clearances from pulmonology, neurology and cardiology prior to  surgery.  -Discussed with patient that we could proceed with reconstruction under local with numbing medication such as lidocaine and do the procedure in office to avoid the risks of anesthesia and the need for surgical clearances.  He could continue his Plavix throughout this time.  I have reached out to our surgical scheduling team to notify them that Dr. Erin Hearing is okay proceeding with local procedure and to coordinate with the Mohs surgeon for [place] to evaluate the patient same day or a few days following his excision.  Patient was in agreement with this plan.  We will plan to follow-up once we receive more details.  Barry Alvarado 01/05/2022, 3:14 PM

## 2022-01-12 ENCOUNTER — Telehealth: Payer: Self-pay

## 2022-01-12 NOTE — Telephone Encounter (Signed)
Called Jen at [place] [phone] ext [phone]. Advised patient was having the nasal reconstruction in our office 11/9 instead of OP. She confirmed to use same referral number [id number].

## 2022-01-26 ENCOUNTER — Ambulatory Visit (INDEPENDENT_AMBULATORY_CARE_PROVIDER_SITE_OTHER): Payer: Medicare Other | Admitting: Plastic Surgery

## 2022-01-26 ENCOUNTER — Telehealth: Payer: Self-pay | Admitting: Plastic Surgery

## 2022-01-26 DIAGNOSIS — C44311 Basal cell carcinoma of skin of nose: Secondary | ICD-10-CM

## 2022-01-26 NOTE — Telephone Encounter (Signed)
Pts care giver called by the office to give the following information biopsy of the nose was done by Dr. Debbrah Alar [place], [place] [address] [phone] and [phone].  She wanted to make sure that the information was given to Dr. Lovena Le for his records.

## 2022-02-01 ENCOUNTER — Ambulatory Visit: Payer: Medicare Other | Admitting: Plastic Surgery

## 2022-02-07 ENCOUNTER — Telehealth: Payer: Self-pay | Admitting: *Deleted

## 2022-02-07 NOTE — Telephone Encounter (Signed)
Called and spoke with the patient and thanked him for getting the information for [place] regarding the biopsy of his nose, and the provider who did the procedure.    Dr. Colletta Maryland Howerter:[address], [place] [address].  Informed the patient that Dr. Lovena Le has all the information and they are working on it.  Patient verbalized understanding and agreed.//AB/CMA

## 2022-03-01 ENCOUNTER — Ambulatory Visit: Payer: Medicare Other | Admitting: Plastic Surgery

## 2022-03-08 NOTE — Progress Notes (Signed)
Mr. Jacobsen is a patient who is referred for evaluation prior to excision of a basal cell carcinoma on the left side of his nose.  Patient has several severe comorbidities as well as being on Plavix.  His oncologic surgeon is located in [place] and then he would have to travel to [place] for reconstruction.  Did not feel that this was appropriate nor safe.  And told the patient this arrangements were made for him to have his excision and reconstruction at one facility.

## 2022-05-24 ENCOUNTER — Other Ambulatory Visit: Payer: Medicare Other

## 2022-05-29 ENCOUNTER — Other Ambulatory Visit: Payer: Self-pay

## 2022-05-29 ENCOUNTER — Other Ambulatory Visit: Payer: Medicare Other

## 2022-05-29 DIAGNOSIS — R7989 Other specified abnormal findings of blood chemistry: Secondary | ICD-10-CM

## 2022-05-29 DIAGNOSIS — C61 Malignant neoplasm of prostate: Secondary | ICD-10-CM

## 2022-05-30 LAB — PSA: Prostate Specific Ag, Serum: 0.1 ng/mL (ref 0.0–4.0)

## 2022-05-30 LAB — TESTOSTERONE: Testosterone: 352 ng/dL (ref 264–916)

## 2022-05-31 ENCOUNTER — Ambulatory Visit: Payer: Medicare Other | Admitting: Urology

## 2022-11-22 ENCOUNTER — Other Ambulatory Visit: Payer: Medicare Other

## 2022-11-29 ENCOUNTER — Other Ambulatory Visit: Payer: Medicare Other | Admitting: Urology

## 2023-01-03 ENCOUNTER — Ambulatory Visit: Payer: Medicare (Managed Care) | Admitting: Urology

## 2023-01-03 VITALS — BP 140/74 | HR 80 | Ht 72.0 in | Wt 217.0 lb

## 2023-01-03 DIAGNOSIS — N401 Enlarged prostate with lower urinary tract symptoms: Secondary | ICD-10-CM | POA: Diagnosis not present

## 2023-01-03 DIAGNOSIS — R351 Nocturia: Secondary | ICD-10-CM

## 2023-01-03 DIAGNOSIS — C61 Malignant neoplasm of prostate: Secondary | ICD-10-CM

## 2023-01-03 DIAGNOSIS — Z8551 Personal history of malignant neoplasm of bladder: Secondary | ICD-10-CM | POA: Diagnosis not present

## 2023-01-03 DIAGNOSIS — N3040 Irradiation cystitis without hematuria: Secondary | ICD-10-CM

## 2023-01-03 DIAGNOSIS — N5201 Erectile dysfunction due to arterial insufficiency: Secondary | ICD-10-CM

## 2023-01-03 DIAGNOSIS — N138 Other obstructive and reflux uropathy: Secondary | ICD-10-CM

## 2023-01-03 DIAGNOSIS — Z87448 Personal history of other diseases of urinary system: Secondary | ICD-10-CM

## 2023-01-03 DIAGNOSIS — N3941 Urge incontinence: Secondary | ICD-10-CM

## 2023-01-03 MED ORDER — CIPROFLOXACIN HCL 500 MG PO TABS
500.0000 mg | ORAL_TABLET | Freq: Once | ORAL | Status: AC
  Administered 2023-01-03: 500 mg via ORAL

## 2023-01-03 MED ORDER — TAMSULOSIN HCL 0.4 MG PO CAPS: 0.4000 mg | ORAL_CAPSULE | Freq: Every day | ORAL | 3 refills | Status: AC

## 2023-01-03 NOTE — Progress Notes (Signed)
Subjective: 1. History of bladder cancer   2. Prostate cancer (HCC)   3. BPH with urinary obstruction   4. Nocturia   5. Urge incontinence   6. Radiation cystitis   7. Erectile dysfunction due to arterial insufficiency      01/03/23: [name] returns today for bladder cancer surveillance and prostate cancer f/u.  His PSA was 0.1 in 2/24 with a T of 352. He denies hematuria or voiding complaints.  He remains on tamsulosin but is down to daily and is off of the Myrbetriq.  His IPSS is 10 with nocturia x 4.  He has no weight loss.  He has no bone pain  but chronic back pain.  He continues to use sildenafil with some response. .  11/30/21: [name] returns today in f/u for cystoscopy for surveillance for his history of bladder cancer and for f/u of his history of prostate cancer.  His last PSA was <0.1 with a testosterone of 39 on 06/29/21.  He didn't get labs prior to this visit. He remains on tamsulosin and Myrbetriq 25mg  (he was on 50 mg but couldn't afford it so he is getting it from a friend) for his voiding symptoms.   He has had no hematuria.  He has nocturia x 2.  He has no incontinence.  He has a reduced stream.  He feels he empties.  He has had no pain.  He has some left side pain over the past month.  He has had no hot flashes.  He has been trying the viagra but doesn't have much response.   06/29/21: [name] returns today in f/u to assess his response to Myrbetriq.  He has had a benefit of the 50mg  dose with reduced frequency and reduced incontinence.  He has nocturia x 4-5 but doesn't wet the bed.   He has  no hematuria or dysuria.   He didn't get labs in February and will need those today.  He remains on tamsulosin and sildenafil.  06/01/21: [name] return today in f/u for the history below.  He cancelled his last appoint because of COVID and is overdue for f/u.  His PSA was <0.1 in 10/22 and his T was < 3.  He got his last Firmagon on 02/16/21.  He was treated for a UTI about 2 weeks  ago.  He was having dysuria that has resolved.  He has no hematuria.   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   He remains on tamsulosin 0.8mg  daily and has sildenafil for ED but it is not working too well.   06/16/20: [name] returns today in f/u.  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  He completed radiation therapy on 06/13/20.   He has no hematuria.  He is voiding ok with an IPSS of 19.  He has nocturia x 2 and an occasional sensation of incomplete emptying.  He has some intermittency.  He has some urgency with occasional UUI.   UA is clear today.  He remains on tamsulosin but is up to bid.   03/04/20: [name] return today follow a TURBT.  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  He is doing well without hematuria.  His UA is unremarkable.  He has an IPSS of 14.  He has a good stream but has some nocturia.  He can have some hesitancy.  He got his initial firmagon on 02/18/20.   He had raised knots on his abdomen.  He is to get marked for EXRT on 04/19/20 at [facility].    02/05/20: [name] returns today in f/u to discuss his recent prostate biopsy on 01/22/20.   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  4/12 cores had Gleason 8 and a total of 9/12 cores are involved with prostate cancer.   He had a negative bone scan on 02/02/20 and a CT hematuria study on 01/19/20 showed no mets.   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  His prostate volume is 45ml. He has 3-10 RBC's on UA today and needs cystoscopy.    GU Hx: [name] is a 77 yo Barry Alvarado who is sent in consultation by Dr. Karie Schwalbe for an elevated PSA with a further rise on a repeat.  I am unable to get the levels as they were not in the referral documents and we are unable to reach anyone in the referring office.  The patient is unaware of the levels.   He has had some issues with voiding  and is on tamsulosin which has been helping.  He has nocturia x 1-2.  He had frequency but that has improved on tamsulosin.  He has urgency and has had UUI but that has improved.  He has a variable stream.  He feels like he doesn't empty completely.  He has no dysuria or hematuria.  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   He has had no UTI's but he may have had stones in the past but no GU surgery.  He has some issues with ED and has been given sildenafil.  He is a former smoker but none in 12 years.      IPSS     Row Name 01/03/23 0900         International Prostate Symptom Score   How often have you had the sensation of not emptying your bladder? Less than half the time     How often have you had to urinate less than every two hours? Less than half the time     How often have you found you stopped and started again several times when you urinated? Not at All     How often have you found it difficult to postpone urination? Less than half the time     How often have you had a weak urinary stream? Less than half the time     How often have you had to strain to start urination? Not at All     How many times did you typically get up at night to urinate? None     Total IPSS Score 8       Quality of Life due to urinary symptoms   If you were to spend the rest of your life with your urinary condition just the way it is now how would you feel about that? Mixed               ROS:  Review of Systems  Constitutional:  Positive for malaise/fatigue.  HENT:  Positive for congestion.   Eyes:  Positive for blurred vision.  Respiratory:  Positive for shortness of breath.   Cardiovascular:  Positive for leg swelling.  Gastrointestinal:  Positive for diarrhea and heartburn.  Neurological:  Positive for dizziness and weakness.  Endo/Heme/Allergies:  Bruises/bleeds  easily.  All other systems reviewed and are negative.   Allergies  Allergen Reactions   Penicillins Hives    Childhood    Atorvastatin Other (See Comments)    Stomach burn   Omeprazole Other (See Comments)    unknown   Shellfish Allergy Swelling    Numb face   Tramadol Other (See Comments)    unknown   Zoloft [Sertraline] Other (See Comments)    unknown    Past Medical History:  Diagnosis Date   Anxiety    CAD (coronary artery disease)    BMS to mid LCX 08/16/09   Cancer (HCC)    Prostate, bladder   COPD (chronic obstructive pulmonary disease) (HCC)    CVA (cerebral vascular accident) (HCC) 2017   He has had 5 strokes.    Erectile dysfunction due to arterial insufficiency    GERD (gastroesophageal reflux disease)    Hypertension     Past Surgical History:  Procedure Laterality Date   CARDIAC CATHETERIZATION  2011   with stent   CHOLECYSTECTOMY  4-5 yrs ago   CYSTOSCOPY  01/29/2020   bladder   TRANSURETHRAL RESECTION OF BLADDER TUMOR WITH MITOMYCIN-C N/A 02/25/2020   Procedure: TRANSURETHRAL RESECTION OF BLADDER TUMOR WITH GEMCITABINE;  Surgeon: Bjorn Pippin, MD;  Location: [facility];  Service: Urology;  Laterality: N/A;    Social History   Socioeconomic History   Marital status: Married    Spouse name: Not on file   Number of children: Not on file   Years of education: Not on file   Highest education level: Not on file  Occupational History   Not on file  Tobacco Use   Smoking status: Former   Smokeless tobacco: Never  Vaping Use   Vaping status: Never Used  Substance and Sexual Activity   Alcohol use: Yes    Comment: occasional   Drug use: Not Currently   Sexual activity: Yes  Other Topics Concern   Not on file  Social History Narrative   Not on file   Social Determinants of Health   Financial Resource Strain: Not on file  Food Insecurity: Not on file  Transportation Needs: Not on file  Physical Activity: Not on file  Stress: Not on file  Social Connections: Not on file  Intimate Partner Violence: Not on file    Family History  Problem Relation Age of Onset    Alzheimer's disease Mother    Heart attack Father     Anti-infectives: Anti-infectives (From admission, onward)    Start     Dose/Rate Route Frequency Ordered Stop   01/03/23 0945  CIPROFLOXACIN HCL 500 MG PO TABS        500 mg Oral  Once 01/03/23 0944 01/03/23 1016       Current Outpatient Medications  Medication Sig Dispense Refill   acetaminophen (TYLENOL) 500 MG tablet Take 1,000 mg by mouth every 8 (eight) hours as needed for moderate pain.     albuterol (VENTOLIN HFA) 108 (90 Base) MCG/ACT inhaler Inhale 2 puffs into the lungs every 6 (six) hours as needed for wheezing or shortness of breath.     amLODipine (NORVASC) 10 MG tablet Take 10 mg by mouth daily.     ascorbic acid (VITAMIN C) 500 MG tablet Take 500 mg by mouth daily.     Carboxymethylcell-Hypromellose (GENTEAL OP) Place 1 drop into both eyes daily as needed (dry eyes).     clopidogrel (PLAVIX) 75 MG tablet Take 75 mg by mouth daily.  colchicine 0.6 MG tablet Take 0.6 mg by mouth daily.     cyanocobalamin (VITAMIN B12) 1000 MCG tablet Take 1,000 mcg by mouth daily.     diclofenac Sodium (VOLTAREN) 1 % GEL Apply 1 Application topically 4 (four) times daily as needed (pain).     escitalopram (LEXAPRO) 5 MG tablet Take 5 mg by mouth daily.     fluticasone-salmeterol (WIXELA INHUB) 500-50 MCG/ACT AEPB Inhale 1 puff into the lungs in the morning and at bedtime.     gabapentin (NEURONTIN) 300 MG capsule Take 300-600 mg by mouth See admin instructions. Take 300 mg twice daily and 600 mg at bedtime     HYDROcodone-acetaminophen (NORCO/VICODIN) 5-325 MG tablet Take 1 tablet by mouth 2 (two) times daily as needed for pain.     ipratropium-albuterol (DUONEB) 0.5-2.5 (3) MG/3ML SOLN Take 3 mLs by nebulization every 6 (six) hours as needed (shortness of breath).     lipase/protease/amylase (CREON) 36000 UNITS CPEP capsule Take 2 capsules by mouth See admin instructions. Take 2 capsules with each meal and 2 capsules with each snack      methocarbamol (ROBAXIN) 500 MG tablet Take 500 mg by mouth 3 (three) times daily as needed for muscle spasms.     Multiple Vitamin (MULTIVITAMIN) tablet Take 1 tablet by mouth daily.     pantoprazole (PROTONIX) 20 MG tablet Take 20 mg by mouth daily.      rosuvastatin (CRESTOR) 20 MG tablet Take 20 mg by mouth daily.     sildenafil (VIAGRA) 100 MG tablet Take 100 mg by mouth daily as needed for erectile dysfunction.     simethicone (MYLICON) 80 MG chewable tablet Chew 80 mg by mouth every 6 (six) hours as needed for flatulence.     sodium bicarbonate 650 MG tablet Take 650 mg by mouth 2 (two) times daily.     Tiotropium Bromide Monohydrate (SPIRIVA RESPIMAT) 2.5 MCG/ACT AERS Inhale 2 each into the lungs daily.     Vitamin D, Ergocalciferol, (DRISDOL) 1.25 MG (50000 UNIT) CAPS capsule Take 50,000 Units by mouth every Friday.     tamsulosin (FLOMAX) 0.4 MG CAPS capsule Take 1 capsule (0.4 mg total) by mouth daily. 90 capsule 3   No current facility-administered medications for this visit.     Objective: Vital signs in last 24 hours: BP (!) 140/74   Pulse 80   Ht 6' (1.829 m)   Wt 217 lb (98.4 kg)   BMI 29.43 kg/m   Intake/Output from previous day: No intake/output data recorded. Intake/Output this shift: @IOTHISSHIFT @   Physical Exam Vitals reviewed.  Constitutional:      Appearance: Normal appearance.  Neurological:     Mental Status: He is alert.     Lab Results:  Results for orders placed or performed in visit on 01/03/23 (from the past 24 hour(s))  PSA     Status: None   Collection Time: 01/03/23 12:53 PM  Result Value Ref Range   Prostate Specific Ag, Serum <0.1 0.0 - 4.0 ng/mL   Narrative   Performed at:  [address] [facility] [address] [place], [place]  [id number] Lab Director: Jolene Schimke MD, Phone:  [phone]       BMET No results for input(s): "NA", "K", "CL", "CO2", "GLUCOSE", "BUN", "CREATININE", "CALCIUM" in the last 72  hours. PT/INR No results for input(s): "LABPROT", "INR" in the last 72 hours. ABG No results for input(s): "PHART", "HCO3" in the last 72 hours.  Invalid input(s): "PCO2", "PO2"  Studies/Results:  Procedure: Cystoscopy.  He was prepped with betadine and 2% lidocaine jelly and then given Cipro 500mg  po.  The urethra was normal.  There external sphincter was pale and consistent radiation atrophy.  The prostate had bilobar hyperplasia with coaptation. There was radiation neovascularity at the bladder neck.  There was mild trabecluation.  No tumors were noted.   The UO's were normal.   Assessment/Plan: Prostate cancer.  His PSA was 0.1 in 2/24.  Will repeat today and in 6 months.      History of Bladder cancer.  He had LG NMIBC without recurrence.  No hematuria.  Cystoscopy in one year.   BPH with BOO.  He will continue  tamsulosin 1 daily.   Radiation cystitis with OAB wet.   He responded to [facility] but he is out of samples.    ED.  He continues Sildenafil but has a poor response. .    Meds ordered this encounter  Medications   ciprofloxacin (CIPRO) tablet 500 mg   tamsulosin (FLOMAX) 0.4 MG CAPS capsule    Sig: Take 1 capsule (0.4 mg total) by mouth daily.    Dispense:  90 capsule    Refill:  3     Orders Placed This Encounter  Procedures   Urinalysis, Routine w reflex microscopic   PSA   PSA    Standing Status:   Future    Standing Expiration Date:   01/03/2024   Cystoscopy     Return in about 6 months (around 07/03/2023) for with PSA.    CC: Dr. Karie Schwalbe.     Bjorn Pippin 01/04/2023 Patient ID: Barry Alvarado, Barry Alvarado   DOB: 1946-03-06, 77 y.o.   MRN: [id number]

## 2023-01-04 LAB — PSA: Prostate Specific Ag, Serum: <0.1 ng/mL (ref 0.0–4.0)

## 2023-01-08 ENCOUNTER — Telehealth: Payer: Self-pay

## 2023-01-08 NOTE — Telephone Encounter (Signed)
Patient is aware of MD response and voiced understanding

## 2023-01-08 NOTE — Telephone Encounter (Signed)
-----   Message from Bjorn Pippin sent at 01/07/2023 12:00 PM EDT ----- PSA is undetectible. ----- Message ----- From: Grier Rocher, CMA Sent: 01/07/2023  11:25 AM EDT To: Bjorn Pippin, MD  Please review PSA labs- disregard if already discussed with patient.

## 2023-06-27 ENCOUNTER — Other Ambulatory Visit: Payer: Medicare (Managed Care)

## 2023-07-04 ENCOUNTER — Ambulatory Visit: Payer: Medicare (Managed Care) | Admitting: Urology

## 2023-08-22 ENCOUNTER — Other Ambulatory Visit: Payer: Medicare (Managed Care)

## 2023-08-27 ENCOUNTER — Other Ambulatory Visit: Payer: Medicare (Managed Care)

## 2023-08-27 DIAGNOSIS — C61 Malignant neoplasm of prostate: Secondary | ICD-10-CM

## 2023-08-28 LAB — PSA: Prostate Specific Ag, Serum: <0.1 ng/mL (ref 0.0–4.0)

## 2023-09-05 ENCOUNTER — Ambulatory Visit: Payer: Medicare (Managed Care) | Admitting: Urology

## 2023-09-05 ENCOUNTER — Encounter: Payer: Self-pay | Admitting: Urology

## 2023-09-05 VITALS — BP 125/70 | HR 97

## 2023-09-05 DIAGNOSIS — N3281 Overactive bladder: Secondary | ICD-10-CM

## 2023-09-05 DIAGNOSIS — R351 Nocturia: Secondary | ICD-10-CM | POA: Diagnosis not present

## 2023-09-05 DIAGNOSIS — N3941 Urge incontinence: Secondary | ICD-10-CM | POA: Diagnosis not present

## 2023-09-05 DIAGNOSIS — Z8546 Personal history of malignant neoplasm of prostate: Secondary | ICD-10-CM | POA: Diagnosis not present

## 2023-09-05 DIAGNOSIS — N401 Enlarged prostate with lower urinary tract symptoms: Secondary | ICD-10-CM | POA: Diagnosis not present

## 2023-09-05 DIAGNOSIS — N138 Other obstructive and reflux uropathy: Secondary | ICD-10-CM

## 2023-09-05 DIAGNOSIS — Z8551 Personal history of malignant neoplasm of bladder: Secondary | ICD-10-CM

## 2023-09-05 DIAGNOSIS — N5201 Erectile dysfunction due to arterial insufficiency: Secondary | ICD-10-CM

## 2023-09-05 DIAGNOSIS — N3040 Irradiation cystitis without hematuria: Secondary | ICD-10-CM

## 2023-09-05 MED ORDER — MIRABEGRON ER 25 MG PO TB24: 25.0000 mg | ORAL_TABLET | Freq: Every day | ORAL | 11 refills | Status: AC

## 2023-09-05 NOTE — Progress Notes (Unsigned)
 Subjective: 1. History of prostate cancer   2. BPH with urinary obstruction   3. Nocturia   4. Urge incontinence   5. Radiation cystitis   6. Erectile dysfunction due to arterial insufficiency   7. History of bladder cancer      09/05/23: 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  He completed radiation therapy on 06/13/20.   His PSA remains undetectible.  He had cystoscopy in 9/24 and isn't due for that until the fall.  HE has had no hematuira. He has BPH with BOO and has been on tamsulosin .  He has nocturia x 0-4.  He has some urgency with occasional UUI but he doesn't require pads.  HE has a good stream.  He feels he is emptying.  He has ED and has been on sildenafil but he is not active at this time. .    01/03/23: [name] returns today for bladder cancer surveillance and prostate cancer f/u.  His PSA was 0.1 in 2/24 with a T of 352. He denies hematuria or voiding complaints.  He remains on tamsulosin  but is down to daily and is off of the Myrbetriq .  His IPSS is 10 with nocturia x 4.  He has no weight loss.  He has no bone pain  but chronic back pain.  He continues to use sildenafil with some response. .  11/30/21: [name] returns today in f/u for cystoscopy for surveillance for his history of bladder cancer and for f/u of his history of prostate cancer.  His last PSA was <0.1 with a testosterone  of 39 on 06/29/21.  He didn't get labs prior to this visit. He remains on tamsulosin  and Myrbetriq  25mg  (he was on 50 mg but couldn't afford it so he is getting it from a friend) for his voiding symptoms.   He has had no hematuria.  He has nocturia x 2.  He has no incontinence.  He has a reduced stream.  He feels he empties.  He has had no pain.  He has some left side pain over the past month.  He has had no hot flashes.  He has been trying the viagra but doesn't have much response.   06/29/21: [name] returns today in f/u  to assess his response to Myrbetriq .  He has had a benefit of the 50mg  dose with reduced frequency and reduced incontinence.  He has nocturia x 4-5 but doesn't wet the bed.   He has  no hematuria or dysuria.   He didn't get labs in February and will need those today.  He remains on tamsulosin  and sildenafil.  06/01/21: [name] return today in f/u for the history below.  He cancelled his last appoint because of COVID and is overdue for f/u.  His PSA was <0.1 in 10/22 and his T was < 3.  He got his last Firmagon  on 02/16/21.  He was treated for a UTI about 2 weeks ago.  He was having dysuria that has resolved.  He has no hematuria.   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   He remains on tamsulosin  0.8mg  daily and has sildenafil for ED but it is not working too well.   06/16/20: [name] returns today in f/u.  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  He  completed radiation therapy on 06/13/20.   He has no hematuria.  He is voiding ok with an IPSS of 19.  He has nocturia x 2 and an occasional sensation of incomplete emptying.  He has some intermittency.  He has some urgency with occasional UUI.   UA is clear today.  He remains on tamsulosin  but is up to bid.   03/04/20: Mr. [name] return today follow a TURBT.  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  He is doing well without hematuria.  His UA is unremarkable.  He has an IPSS of 14.  He has a good stream but has some nocturia.  He can have some hesitancy.   He got his initial firmagon  on 02/18/20.   He had raised knots on his abdomen.  He is to get marked for EXRT on [facility] at [facility].    02/05/20: Mr. [name] returns today in f/u to discuss his recent prostate biopsy on 01/22/20.   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  4/12 cores had Gleason 8 and a total of 9/12 cores are involved with prostate cancer.    He had a negative bone scan on 02/02/20 and a CT hematuria study on 01/19/20 showed no mets.   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  His prostate volume is 45ml. He has 3-10 RBC's on UA today and needs cystoscopy.    GU Hx: [name] is a 78 yo male who is sent in consultation by Dr. Cesar Kolumbo for an elevated PSA with a further rise on a repeat.  I am unable to get the levels as they were not in the referral documents and we are unable to reach anyone in the referring office.  The patient is unaware of the levels.   He has had some issues with voiding and is on tamsulosin  which has been helping.  He has nocturia x 1-2.  He had frequency but that has improved on tamsulosin .  He has urgency and has had UUI but that has improved.  He has a variable stream.  He feels like he doesn't empty completely.  He has no dysuria or hematuria.  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   He has had no UTI's but he may have had stones in the past but no GU surgery.  He has some issues with ED and has been given sildenafil.  He is a former smoker but none in 12 years.         ROS:  Review of Systems  Constitutional:  Positive for malaise/fatigue.  HENT:  Positive for congestion.   Eyes:  Positive for blurred vision.  Respiratory:  Positive for shortness of breath.   Cardiovascular:  Positive for leg swelling.  Gastrointestinal:  Positive for diarrhea and heartburn.  Neurological:  Positive for dizziness and weakness.  Endo/Heme/Allergies:  Bruises/bleeds easily.  All other systems reviewed and are negative.   Allergies  Allergen Reactions   Penicillins Hives    Childhood   Atorvastatin Other (See Comments)    Stomach burn   Omeprazole Other (See Comments)    unknown   Shellfish Allergy Swelling    Numb face   Tramadol Other (See Comments)    unknown   Zoloft [Sertraline] Other (See Comments)    unknown    Past Medical History:  Diagnosis Date   Anxiety    CAD (coronary artery  disease)    BMS to mid LCX 08/16/09   Cancer [facility])    Prostate, bladder   COPD (chronic obstructive pulmonary disease) (HCC)    CVA (cerebral vascular accident) (HCC) 2017   He has had 5 strokes.    Erectile dysfunction due to arterial insufficiency    GERD (gastroesophageal reflux disease)    Hypertension     Past Surgical History:  Procedure Laterality Date   CARDIAC CATHETERIZATION  2011   with stent   CHOLECYSTECTOMY  4-5 yrs ago   CYSTOSCOPY  01/29/2020   bladder   TRANSURETHRAL RESECTION OF BLADDER TUMOR WITH MITOMYCIN -C N/A 02/25/2020   Procedure: TRANSURETHRAL RESECTION OF BLADDER TUMOR WITH GEMCITABINE ;  Surgeon: Homero Luster, MD;  Location: [facility];  Service: Urology;  Laterality: N/A;    Social History   Socioeconomic History   Marital status: Married    Spouse name: Not on file   Number of children: Not on file   Years of education: Not on file   Highest education level: Not on file  Occupational History   Not on file  Tobacco Use   Smoking status: Former   Smokeless tobacco: Never  Vaping Use   Vaping status: Never Used  Substance and Sexual Activity   Alcohol use: Yes    Comment: occasional   Drug use: Not Currently   Sexual activity: Yes  Other Topics Concern   Not on file  Social History Narrative   Not on file   Social Drivers of Health   Financial Resource Strain: Not on file  Food Insecurity: Not on file  Transportation Needs: Not on file  Physical Activity: Not on file  Stress: Not on file  Social Connections: Not on file  Intimate Partner Violence: Not on file    Family History  Problem Relation Age of Onset   Alzheimer's disease Mother    Heart attack Father     Anti-infectives: Anti-infectives (From admission, onward)    None       Current Outpatient Medications  Medication Sig Dispense Refill   mirabegron  ER (MYRBETRIQ ) 25 MG TB24 tablet Take 1 tablet (25 mg total) by mouth daily. 30 tablet 11   acetaminophen (TYLENOL)  500 MG tablet Take 1,000 mg by mouth every 8 (eight) hours as needed for moderate pain.     albuterol (VENTOLIN HFA) 108 (90 Base) MCG/ACT inhaler Inhale 2 puffs into the lungs every 6 (six) hours as needed for wheezing or shortness of breath.     amLODipine (NORVASC) 10 MG tablet Take 10 mg by mouth daily.     ascorbic acid (VITAMIN C) 500 MG tablet Take 500 mg by mouth daily.     Carboxymethylcell-Hypromellose (GENTEAL OP) Place 1 drop into both eyes daily as needed (dry eyes).     clopidogrel (PLAVIX) 75 MG tablet Take 75 mg by mouth daily.     colchicine 0.6 MG tablet Take 0.6 mg by mouth daily.     cyanocobalamin (VITAMIN B12) 1000 MCG tablet Take 1,000 mcg by mouth daily.     diclofenac Sodium (VOLTAREN) 1 % GEL Apply 1 Application topically 4 (four) times daily as needed (pain).     escitalopram (LEXAPRO) 5 MG tablet Take 5 mg by mouth daily.     fluticasone-salmeterol (WIXELA INHUB) 500-50 MCG/ACT AEPB Inhale 1 puff into the lungs in the morning and at bedtime.     gabapentin (NEURONTIN) 300 MG capsule Take 300-600 mg by mouth See admin instructions. Take 300 mg twice  daily and 600 mg at bedtime     HYDROcodone-acetaminophen (NORCO/VICODIN) 5-325 MG tablet Take 1 tablet by mouth 2 (two) times daily as needed for pain.     ipratropium-albuterol (DUONEB) 0.5-2.5 (3) MG/3ML SOLN Take 3 mLs by nebulization every 6 (six) hours as needed (shortness of breath).     lipase/protease/amylase (CREON) 36000 UNITS CPEP capsule Take 2 capsules by mouth See admin instructions. Take 2 capsules with each meal and 2 capsules with each snack     methocarbamol (ROBAXIN) 500 MG tablet Take 500 mg by mouth 3 (three) times daily as needed for muscle spasms.     Multiple Vitamin (MULTIVITAMIN) tablet Take 1 tablet by mouth daily.     pantoprazole (PROTONIX) 20 MG tablet Take 20 mg by mouth daily.      rosuvastatin (CRESTOR) 20 MG tablet Take 20 mg by mouth daily.     sildenafil (VIAGRA) 100 MG tablet Take 100 mg  by mouth daily as needed for erectile dysfunction.     simethicone (MYLICON) 80 MG chewable tablet Chew 80 mg by mouth every 6 (six) hours as needed for flatulence.     sodium bicarbonate 650 MG tablet Take 650 mg by mouth 2 (two) times daily.     tamsulosin  (FLOMAX ) 0.4 MG CAPS capsule Take 1 capsule (0.4 mg total) by mouth daily. 90 capsule 3   Tiotropium Bromide Monohydrate (SPIRIVA RESPIMAT) 2.5 MCG/ACT AERS Inhale 2 each into the lungs daily.     Vitamin D, Ergocalciferol, (DRISDOL) 1.25 MG (50000 UNIT) CAPS capsule Take 50,000 Units by mouth every Friday.     No current facility-administered medications for this visit.     Objective: Vital signs in last 24 hours: BP 125/70   Pulse 97   Intake/Output from previous day: No intake/output data recorded. Intake/Output this shift: @IOTHISSHIFT @   Physical Exam Vitals reviewed.  Constitutional:      Appearance: Normal appearance.  Neurological:     Mental Status: He is alert.     Lab Results:  No results found for this or any previous visit (from the past 24 hours). Lab Results  Component Value Date   PSA1 <0.1 08/27/2023   PSA1 <0.1 01/03/2023   PSA1 0.1 05/29/2022   He was unable to get a UA.      BMET No results for input(s): "NA", "K", "CL", "CO2", "GLUCOSE", "BUN", "CREATININE", "CALCIUM" in the last 72 hours. PT/INR No results for input(s): "LABPROT", "INR" in the last 72 hours. ABG No results for input(s): "PHART", "HCO3" in the last 72 hours.  Invalid input(s): "PCO2", "PO2"  Studies/Results:    Assessment/Plan: Prostate cancer.  His PSA is undetectible.  Repeat in 6 months.       History of Bladder cancer.  He had LG NMIBC without recurrence.  No hematuria.  Cystoscopy in 6 months.   BPH with BOO.  He will continue  tamsulosin  1 daily.   Radiation cystitis with OAB wet.   He responded to Myrbetriq  but he is out of samples.  I will send a script since he still has UUI.   ED.  He is not currently  active.  .    Meds ordered this encounter  Medications   mirabegron  ER (MYRBETRIQ ) 25 MG TB24 tablet    Sig: Take 1 tablet (25 mg total) by mouth daily.    Dispense:  30 tablet    Refill:  11     Orders Placed This Encounter  Procedures   Urinalysis, Routine  w reflex microscopic   PSA    Standing Status:   Future    Expected Date:   03/07/2024    Expiration Date:   09/04/2024     Return in about 6 months (around 03/07/2024) for cystoscopy with available MD. .    CC: Dr. Cesar Kolumbo.     Homero Luster 09/05/2023 Patient ID: Barry Alvarado, male   DOB: 04/30/1945, 78 y.o.   MRN: [id number]

## 2023-09-06 ENCOUNTER — Encounter: Payer: Self-pay | Admitting: Urology

## 2024-03-03 ENCOUNTER — Other Ambulatory Visit: Payer: Medicare (Managed Care)

## 2024-03-10 ENCOUNTER — Other Ambulatory Visit: Payer: Medicare (Managed Care) | Admitting: Urology

## 2024-04-21 ENCOUNTER — Other Ambulatory Visit: Payer: Medicare (Managed Care)

## 2024-04-21 DIAGNOSIS — Z8546 Personal history of malignant neoplasm of prostate: Secondary | ICD-10-CM

## 2024-04-22 ENCOUNTER — Ambulatory Visit: Payer: Self-pay

## 2024-04-22 LAB — PSA: Prostate Specific Ag, Serum: 0.1 ng/mL (ref 0.0–4.0)

## 2024-04-27 NOTE — Progress Notes (Signed)
 "  Assessment:  1.  Grade group 4 prostate cancer, status post radiotherapy in 2022.  No evidence of recurrence.    2.  History of LG NMIBC, status post resection in 2021.  No evidence of recurrence.  3.  BPH with LUTS.  On tamsulosin .   4.  ED.  He is not currently active.  5.  Nocturia, bothersome.  He does have peripheral edema  Plan:  1.  At this point we will moved to annual cystoscopies  2.  I will have him drop in for PSA only in 6 months, PSA with office visit for cystoscopy in 1 year  3.  Limit salt in diet, limit evening fluids.  Would be worthwhile having his PCP consider morning diuretic.  HPI:     1.6.2026: This man is here today for cystoscopy.  Prior to this visit he has been seen by Dr. Watt.  He also has a history of radiotherapy (completed 06/13/2020) for grade group 4 prostate cancer.  PSA done last week was 0.1, stable.  Has BPH symptoms and is on tamsulosin .  He does have nocturia x 3-4.  He does have significant pretibial edema bilaterally.   09/05/23: Mr. Colter returns today in f/u for his history of LG NMIBC of the left trigone in 2021 and Gleason 8 prostate cancer.  He completed radiation therapy on 06/13/20.   His PSA remains undetectible.  He had cystoscopy in 9/24 and isn't due for that until the fall.  HE has had no hematuira. He has BPH with BOO and has been on tamsulosin .  He has nocturia x 0-4.  He has some urgency with occasional UUI but he doesn't require pads.  HE has a good stream.  He feels he is emptying.  He has ED and has been on sildenafil but he is not active at this time. .    01/03/23: Mr. Conran returns today for bladder cancer surveillance and prostate cancer f/u.  His PSA was 0.1 in 2/24 with a T of 352. He denies hematuria or voiding complaints.  He remains on tamsulosin  but is down to daily and is off of the Myrbetriq .  His IPSS is 10 with nocturia x 4.  He has no weight loss.  He has no bone pain  but chronic back pain.  He continues to  use sildenafil with some response. .  11/30/21: Mr. Leverich returns today in f/u for cystoscopy for surveillance for his history of bladder cancer and for f/u of his history of prostate cancer.  His last PSA was <0.1 with a testosterone  of 39 on 06/29/21.  He didn't get labs prior to this visit. He remains on tamsulosin  and Myrbetriq  25mg  (he was on 50 mg but couldn't afford it so he is getting it from a friend) for his voiding symptoms.   He has had no hematuria.  He has nocturia x 2.  He has no incontinence.  He has a reduced stream.  He feels he empties.  He has had no pain.  He has some left side pain over the past month.  He has had no hot flashes.  He has been trying the viagra but doesn't have much response.   06/29/21: Mr. Esco returns today in f/u to assess his response to Myrbetriq .  He has had a benefit of the 50mg  dose with reduced frequency and reduced incontinence.  He has nocturia x 4-5 but doesn't wet the bed.   He has  no hematuria or dysuria.  He didn't get labs in February and will need those today.  He remains on tamsulosin  and sildenafil.  06/01/21: Mr. Geimer return today in f/u for the history below.  He cancelled his last appoint because of COVID and is overdue for f/u.  His PSA was <0.1 in 10/22 and his T was < 3.  He got his last Firmagon  on 02/16/21.  He was treated for a UTI about 2 weeks ago.  He was having dysuria that has resolved.  He has no hematuria.   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   He remains on tamsulosin  0.8mg  daily and has sildenafil for ED but it is not working too well.   06/16/20: Mr. Vizcarrondo returns today in f/u.  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  He completed radiation therapy on 06/13/20.   He has no hematuria.  He is voiding ok with an IPSS of 19.  He has nocturia x 2 and an occasional sensation of incomplete emptying.  He has some intermittency.  He has some urgency  with occasional UUI.   UA is clear today.  He remains on tamsulosin  but is up to bid.   03/04/20: Mr. Dacy return today follow a TURBT.  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  He is doing well without hematuria.  His UA is unremarkable.  He has an IPSS of 14.  He has a good stream but has some nocturia.  He can have some hesitancy.   He got his initial firmagon  on 02/18/20.   He had raised knots on his abdomen.  He is to get marked for EXRT on 04/19/20 at [facility].    02/05/20: Mr. Holcomb returns today in f/u to discuss his recent prostate biopsy on 01/22/20.   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  4/12 cores had Gleason 8 and a total of 9/12 cores are involved with prostate cancer.   He had a negative bone scan on 02/02/20 and a CT hematuria study on 01/19/20 showed no mets.   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  His prostate volume is 45ml. He has 3-10 RBC's on UA today and needs cystoscopy.    GU Hx: Mr. Szeto is a 79 yo male who is sent in consultation by Dr. Cesar Kolumbo for an elevated PSA with a further rise on a repeat.  I am unable to get the levels as they were not in the referral documents and we are unable to reach anyone in the referring office.  The patient is unaware of the levels.   He has had some issues with voiding and is on tamsulosin  which has been helping.  He has nocturia x 1-2.  He had frequency but that has improved on tamsulosin .  He has urgency and has had UUI but that has improved.  He has a variable stream.  He feels like he doesn't empty completely.  He has no dysuria or hematuria.  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   He has had no UTI's but he may have had stones in the past but no GU surgery.  He has some issues with ED and has been given sildenafil.  He is a former smoker but none in 12 years.  ROS:  Review of Systems  Constitutional:  Positive for malaise/fatigue.  HENT:   Positive for congestion.   Eyes:  Positive for blurred vision.  Respiratory:  Positive for shortness of breath.   Cardiovascular:  Positive for leg swelling.  Gastrointestinal:  Positive for diarrhea and heartburn.  Neurological:  Positive for dizziness and weakness.  Endo/Heme/Allergies:  Bruises/bleeds easily.  All other systems reviewed and are negative.   Allergies  Allergen Reactions   Penicillins Hives    Childhood   Atorvastatin Other (See Comments)    Stomach burn   Omeprazole Other (See Comments)    unknown   Shellfish Allergy Swelling    Numb face   Tramadol Other (See Comments)    unknown   Zoloft [Sertraline] Other (See Comments)    unknown    Past Medical History:  Diagnosis Date   Anxiety    CAD (coronary artery disease)    BMS to mid LCX 08/16/09   Cancer (HCC)    Prostate, bladder   COPD (chronic obstructive pulmonary disease) (HCC)    CVA (cerebral vascular accident) (HCC) 2017   He has had 5 strokes.    Erectile dysfunction due to arterial insufficiency    GERD (gastroesophageal reflux disease)    Hypertension     Past Surgical History:  Procedure Laterality Date   CARDIAC CATHETERIZATION  2011   with stent   CHOLECYSTECTOMY  4-5 yrs ago   CYSTOSCOPY  01/29/2020   bladder   TRANSURETHRAL RESECTION OF BLADDER TUMOR WITH MITOMYCIN -C N/A 02/25/2020   Procedure: TRANSURETHRAL RESECTION OF BLADDER TUMOR WITH GEMCITABINE ;  Surgeon: Watt Rush, MD;  Location: [facility];  Service: Urology;  Laterality: N/A;    Social History   Socioeconomic History   Marital status: Married    Spouse name: Not on file   Number of children: Not on file   Years of education: Not on file   Highest education level: Not on file  Occupational History   Not on file  Tobacco Use   Smoking status: Former   Smokeless tobacco: Never  Vaping Use   Vaping status: Never Used  Substance and Sexual Activity   Alcohol use: Yes    Comment: occasional   Drug use: Not  Currently   Sexual activity: Yes  Other Topics Concern   Not on file  Social History Narrative   Not on file   Social Drivers of Health   Tobacco Use: Medium Risk (11/12/2023)   Received from [facility]   Patient History    Passive Exposure: Not on file    Smokeless Tobacco Use: Never    Smoking Tobacco Use: Former  Programmer, 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 [employer] Insecurity: Not on file  Transportation Needs: Not on file  Physical Activity: Not on file  Stress: Not on file  Social Connections: Not on file  Intimate Partner Violence: Not on file  Depression (PHQ2-9): Not on file  Alcohol Screen: Not on file  Housing: Not on file  Utilities: Not on file  Health Literacy: Not on file    Family History  Problem Relation Age of Onset   Alzheimer's disease Mother    Heart attack Father     Anti-infectives: Anti-infectives (From admission, onward)    None       Current Outpatient Medications  Medication Sig Dispense Refill   acetaminophen (TYLENOL) 500 MG tablet Take 1,000 mg by mouth every 8 (eight) hours as needed for moderate pain.  albuterol (VENTOLIN HFA) 108 (90 Base) MCG/ACT inhaler Inhale 2 puffs into the lungs every 6 (six) hours as needed for wheezing or shortness of breath.     amLODipine (NORVASC) 10 MG tablet Take 10 mg by mouth daily.     ascorbic acid (VITAMIN C) 500 MG tablet Take 500 mg by mouth daily.     Carboxymethylcell-Hypromellose (GENTEAL OP) Place 1 drop into both eyes daily as needed (dry eyes).     clopidogrel (PLAVIX) 75 MG tablet Take 75 mg by mouth daily.     colchicine 0.6 MG tablet Take 0.6 mg by mouth daily.     cyanocobalamin (VITAMIN B12) 1000 MCG tablet Take 1,000 mcg by mouth daily.     diclofenac Sodium (VOLTAREN) 1 % GEL Apply 1 Application topically 4 (four) times daily as needed (pain).     escitalopram (LEXAPRO) 5 MG tablet Take 5 mg by mouth daily.     fluticasone-salmeterol (WIXELA INHUB) 500-50 MCG/ACT AEPB Inhale 1  puff into the lungs in the morning and at bedtime.     gabapentin (NEURONTIN) 300 MG capsule Take 300-600 mg by mouth See admin instructions. Take 300 mg twice daily and 600 mg at bedtime     HYDROcodone-acetaminophen (NORCO/VICODIN) 5-325 MG tablet Take 1 tablet by mouth 2 (two) times daily as needed for pain.     ipratropium-albuterol (DUONEB) 0.5-2.5 (3) MG/3ML SOLN Take 3 mLs by nebulization every 6 (six) hours as needed (shortness of breath).     lipase/protease/amylase (CREON) 36000 UNITS CPEP capsule Take 2 capsules by mouth See admin instructions. Take 2 capsules with each meal and 2 capsules with each snack     methocarbamol (ROBAXIN) 500 MG tablet Take 500 mg by mouth 3 (three) times daily as needed for muscle spasms.     mirabegron  ER (MYRBETRIQ ) 25 MG TB24 tablet Take 1 tablet (25 mg total) by mouth daily. 30 tablet 11   Multiple Vitamin (MULTIVITAMIN) tablet Take 1 tablet by mouth daily.     pantoprazole (PROTONIX) 20 MG tablet Take 20 mg by mouth daily.      rosuvastatin (CRESTOR) 20 MG tablet Take 20 mg by mouth daily.     sildenafil (VIAGRA) 100 MG tablet Take 100 mg by mouth daily as needed for erectile dysfunction.     simethicone (MYLICON) 80 MG chewable tablet Chew 80 mg by mouth every 6 (six) hours as needed for flatulence.     sodium bicarbonate 650 MG tablet Take 650 mg by mouth 2 (two) times daily.     tamsulosin  (FLOMAX ) 0.4 MG CAPS capsule Take 1 capsule (0.4 mg total) by mouth daily. 90 capsule 3   Tiotropium Bromide Monohydrate (SPIRIVA RESPIMAT) 2.5 MCG/ACT AERS Inhale 2 each into the lungs daily.     Vitamin D, Ergocalciferol, (DRISDOL) 1.25 MG (50000 UNIT) CAPS capsule Take 50,000 Units by mouth every Friday.     No current facility-administered medications for this visit.     Objective: Vital signs in last 24 hours: There were no vitals taken for this visit.  Intake/Output from previous day: No intake/output data recorded. Intake/Output this  shift: @IOTHISSHIFT @   Physical Exam Vitals reviewed.  Constitutional:      Appearance: Normal appearance.  Neurological:     Mental Status: He is alert.     Lab Results:  No results found for this or any previous visit (from the past 24 hours). Lab Results  Component Value Date   PSA1 0.1 04/21/2024   PSA1 <0.1  08/27/2023   PSA1 <0.1 01/03/2023   He was unable to get a UA.      BMET No results for input(s): NA, K, CL, CO2, GLUCOSE, BUN, CREATININE, CALCIUM in the last 72 hours. PT/INR No results for input(s): LABPROT, INR in the last 72 hours. ABG No results for input(s): PHART, HCO3 in the last 72 hours.  Invalid input(s): PCO2, PO2  Studies/Results:  Cystoscopy Procedure Note:  Indication: Bladder cancer surveillance  After informed consent and discussion of the procedure and its risks, HITOSHI WERTS was positioned and prepped in the standard fashion.  Cystoscopy was performed with a flexible cystoscope.   Findings: Urethra: No stricture, no lesion Prostate: Normally obstructive Bladder neck: Open Ureteral orifices: Normal in configuration and location bilaterally Bladder: Mildly telangiectatic vessels at the bladder neck area consistent with radiation change.  No urothelial abnormalities.  No significant trabeculations.  The patient tolerated the procedure well.     SABRA  "

## 2024-04-28 ENCOUNTER — Ambulatory Visit: Payer: Medicare (Managed Care) | Admitting: Urology

## 2024-04-28 VITALS — BP 161/70 | HR 101

## 2024-04-28 DIAGNOSIS — Z08 Encounter for follow-up examination after completed treatment for malignant neoplasm: Secondary | ICD-10-CM

## 2024-04-28 DIAGNOSIS — Z8551 Personal history of malignant neoplasm of bladder: Secondary | ICD-10-CM | POA: Diagnosis not present

## 2024-04-28 DIAGNOSIS — Z8546 Personal history of malignant neoplasm of prostate: Secondary | ICD-10-CM | POA: Diagnosis not present

## 2024-04-28 DIAGNOSIS — N401 Enlarged prostate with lower urinary tract symptoms: Secondary | ICD-10-CM | POA: Diagnosis not present

## 2024-04-28 DIAGNOSIS — N3040 Irradiation cystitis without hematuria: Secondary | ICD-10-CM

## 2024-04-28 DIAGNOSIS — R351 Nocturia: Secondary | ICD-10-CM | POA: Diagnosis not present

## 2024-04-28 MED ORDER — CIPROFLOXACIN HCL 500 MG PO TABS
500.0000 mg | ORAL_TABLET | Freq: Once | ORAL | Status: AC
  Administered 2024-04-28: 500 mg via ORAL

## 2024-10-27 ENCOUNTER — Other Ambulatory Visit: Payer: Medicare (Managed Care)

## 2025-04-19 ENCOUNTER — Other Ambulatory Visit: Payer: Medicare (Managed Care)
